# Patient Record
Sex: Male | Born: 1960 | Race: White | Hispanic: No | Marital: Married | Smoking: Never smoker
Health system: Southern US, Community
[De-identification: ages and names within clinical notes are randomized; demographics above are authoritative.]

## PROBLEM LIST (undated history)

## (undated) DIAGNOSIS — G4730 Sleep apnea, unspecified: Secondary | ICD-10-CM

## (undated) DIAGNOSIS — G4700 Insomnia, unspecified: Secondary | ICD-10-CM

## (undated) HISTORY — PX: NO PAST SURGERIES: SHX2092

## (undated) HISTORY — DX: Sleep apnea, unspecified: G47.30

## (undated) HISTORY — DX: Insomnia, unspecified: G47.00

---

## 2009-08-10 ENCOUNTER — Observation Stay: Payer: Self-pay | Admitting: Internal Medicine

## 2011-02-09 ENCOUNTER — Ambulatory Visit (INDEPENDENT_AMBULATORY_CARE_PROVIDER_SITE_OTHER): Payer: BC Managed Care – PPO

## 2011-02-09 DIAGNOSIS — R142 Eructation: Secondary | ICD-10-CM

## 2011-02-09 DIAGNOSIS — R141 Gas pain: Secondary | ICD-10-CM

## 2011-02-09 DIAGNOSIS — R1032 Left lower quadrant pain: Secondary | ICD-10-CM

## 2011-02-09 DIAGNOSIS — R110 Nausea: Secondary | ICD-10-CM

## 2011-02-10 ENCOUNTER — Other Ambulatory Visit: Payer: Self-pay | Admitting: Emergency Medicine

## 2011-02-13 ENCOUNTER — Other Ambulatory Visit: Payer: Self-pay | Admitting: Gastroenterology

## 2011-02-13 DIAGNOSIS — R1011 Right upper quadrant pain: Secondary | ICD-10-CM

## 2011-02-13 DIAGNOSIS — R6881 Early satiety: Secondary | ICD-10-CM

## 2011-02-14 ENCOUNTER — Ambulatory Visit
Admission: RE | Admit: 2011-02-14 | Discharge: 2011-02-14 | Disposition: A | Discharge status: Home / Self Care | Payer: BC Managed Care – PPO | Source: Ambulatory Visit | Attending: Emergency Medicine | Admitting: Emergency Medicine

## 2011-02-20 ENCOUNTER — Encounter (HOSPITAL_COMMUNITY)
Admission: RE | Admit: 2011-02-20 | Discharge: 2011-02-20 | Disposition: A | Discharge status: Home / Self Care | Payer: BC Managed Care – PPO | Source: Ambulatory Visit | Attending: Gastroenterology | Admitting: Gastroenterology

## 2011-02-20 DIAGNOSIS — R6881 Early satiety: Secondary | ICD-10-CM | POA: Insufficient documentation

## 2011-02-20 DIAGNOSIS — R1011 Right upper quadrant pain: Secondary | ICD-10-CM | POA: Insufficient documentation

## 2011-02-20 MED ORDER — TECHNETIUM TC 99M SULFUR COLLOID
2.0000 | Freq: Once | INTRAVENOUS | Status: AC | PRN
  Administered 2011-02-20: 2 via INTRAVENOUS

## 2012-12-09 ENCOUNTER — Ambulatory Visit: Payer: Self-pay | Admitting: Gastroenterology

## 2012-12-11 ENCOUNTER — Ambulatory Visit: Payer: Self-pay | Admitting: Family Medicine

## 2013-01-09 ENCOUNTER — Ambulatory Visit: Payer: Self-pay | Admitting: Family Medicine

## 2013-02-26 ENCOUNTER — Ambulatory Visit: Payer: Self-pay | Admitting: Family

## 2013-03-09 ENCOUNTER — Ambulatory Visit: Payer: Self-pay | Admitting: Family

## 2015-05-31 ENCOUNTER — Ambulatory Visit (INDEPENDENT_AMBULATORY_CARE_PROVIDER_SITE_OTHER): Payer: Commercial Managed Care - HMO | Admitting: Family Medicine

## 2015-05-31 ENCOUNTER — Encounter: Payer: Self-pay | Admitting: Family Medicine

## 2015-05-31 VITALS — BP 106/62 | HR 68 | Temp 98.7°F | Resp 16 | Ht 69.0 in | Wt 228.6 lb

## 2015-05-31 DIAGNOSIS — K3184 Gastroparesis: Secondary | ICD-10-CM | POA: Insufficient documentation

## 2015-05-31 DIAGNOSIS — Z889 Allergy status to unspecified drugs, medicaments and biological substances status: Secondary | ICD-10-CM | POA: Insufficient documentation

## 2015-05-31 DIAGNOSIS — R739 Hyperglycemia, unspecified: Secondary | ICD-10-CM

## 2015-05-31 DIAGNOSIS — Z9989 Dependence on other enabling machines and devices: Secondary | ICD-10-CM

## 2015-05-31 DIAGNOSIS — G4733 Obstructive sleep apnea (adult) (pediatric): Secondary | ICD-10-CM | POA: Insufficient documentation

## 2015-05-31 DIAGNOSIS — G4700 Insomnia, unspecified: Secondary | ICD-10-CM | POA: Insufficient documentation

## 2015-05-31 LAB — POCT GLYCOSYLATED HEMOGLOBIN (HGB A1C): Hemoglobin A1C: 5.4

## 2015-05-31 NOTE — Patient Instructions (Signed)
Return as needed

## 2015-05-31 NOTE — Progress Notes (Signed)
Subjective:     Patient ID: Luke Stewart, male   DOB: July 19, 1960, 55 y.o.   MRN: [id number]  HPI  Chief Complaint  Patient presents with  . Abnormal Lab    Patient comes in office rquestin to have HgbA1C screened. Patient just had labs done through his employeer and Glucose level was 122,in 03/28/14  Glucose was at 132. Patient is unable to past certification screening through employeer till lab is drawn.   States his fasting sugars always tend to be high. Attributes it to his history of gastroparesis (prior G.I. Workup).Reports he eats several small meals daily to accomodate. 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 States he has had a normal colonoscopy and prior G.I.evaluaiton for mildly elevated liver tests c/w fatty liver.   Review of Systems     Objective:   Physical Exam  Constitutional: He appears well-developed and well-nourished. No distress.       Assessment:    1. Hyperglycemia - POCT glycosylated hemoglobin (Hb A1C)    Plan:    F/u prn.

## 2015-06-21 ENCOUNTER — Telehealth: Payer: Self-pay | Admitting: *Deleted

## 2015-06-21 DIAGNOSIS — R739 Hyperglycemia, unspecified: Secondary | ICD-10-CM

## 2015-06-21 NOTE — Telephone Encounter (Signed)
Labs ordered, printed and waiting up-front.

## 2015-07-14 ENCOUNTER — Encounter: Payer: Self-pay | Admitting: Family Medicine

## 2015-12-03 DIAGNOSIS — R739 Hyperglycemia, unspecified: Secondary | ICD-10-CM | POA: Insufficient documentation

## 2016-10-05 DIAGNOSIS — K3184 Gastroparesis: Secondary | ICD-10-CM | POA: Diagnosis not present

## 2016-10-05 DIAGNOSIS — R739 Hyperglycemia, unspecified: Secondary | ICD-10-CM | POA: Diagnosis not present

## 2016-11-07 DIAGNOSIS — F411 Generalized anxiety disorder: Secondary | ICD-10-CM | POA: Insufficient documentation

## 2016-11-07 DIAGNOSIS — Z0000 Encounter for general adult medical examination without abnormal findings: Secondary | ICD-10-CM | POA: Diagnosis not present

## 2016-11-07 DIAGNOSIS — G4733 Obstructive sleep apnea (adult) (pediatric): Secondary | ICD-10-CM | POA: Diagnosis not present

## 2016-11-07 DIAGNOSIS — R739 Hyperglycemia, unspecified: Secondary | ICD-10-CM | POA: Diagnosis not present

## 2016-11-22 DIAGNOSIS — R110 Nausea: Secondary | ICD-10-CM | POA: Diagnosis not present

## 2016-11-22 DIAGNOSIS — R300 Dysuria: Secondary | ICD-10-CM | POA: Diagnosis not present

## 2016-11-22 DIAGNOSIS — R509 Fever, unspecified: Secondary | ICD-10-CM | POA: Diagnosis not present

## 2016-11-22 DIAGNOSIS — N10 Acute tubulo-interstitial nephritis: Secondary | ICD-10-CM | POA: Diagnosis not present

## 2016-11-22 DIAGNOSIS — N410 Acute prostatitis: Secondary | ICD-10-CM | POA: Diagnosis not present

## 2016-12-04 ENCOUNTER — Ambulatory Visit: Payer: 59 | Admitting: Gastroenterology

## 2016-12-04 NOTE — Progress Notes (Deleted)
He has been referred for gastroparesis. He has  had a gastric emptying study back in 2013 and was diagnosed.         With history of non diabetic gastroparesis diagnosed by GES in 2013. Discussed long term plan for gastroparesis- usually affects solids > liquids. Suggest low fat, low residue diet, limit/avoid narcotics. Have multiple small meals more often rather than heavy meals. Will check TSH,Hba1c Perform EGD to r/o gastric outlet obstruction or a Bezoar. Options to treat an acute episode would be to back on on oral solids, promote liquid diet , Reglan to be used as a short term measure due to side effects with long term use. Provided patient information on gastroparesis.    I have discussed alternative options, risks & benefits,  which include, but are not limited to, bleeding, infection, perforation,respiratory complication & drug reaction.  The patient agrees with this plan & written consent will be obtained.

## 2016-12-06 ENCOUNTER — Ambulatory Visit (INDEPENDENT_AMBULATORY_CARE_PROVIDER_SITE_OTHER): Payer: 59 | Admitting: Gastroenterology

## 2016-12-06 ENCOUNTER — Encounter: Payer: Self-pay | Admitting: Gastroenterology

## 2016-12-06 ENCOUNTER — Other Ambulatory Visit
Admission: RE | Admit: 2016-12-06 | Discharge: 2016-12-06 | Disposition: A | Discharge status: Home / Self Care | Payer: 59 | Source: Ambulatory Visit | Attending: Gastroenterology | Admitting: Gastroenterology

## 2016-12-06 ENCOUNTER — Encounter (INDEPENDENT_AMBULATORY_CARE_PROVIDER_SITE_OTHER): Payer: Self-pay

## 2016-12-06 VITALS — BP 123/77 | HR 61 | Temp 98.1°F | Ht 69.0 in | Wt 224.4 lb

## 2016-12-06 DIAGNOSIS — K3184 Gastroparesis: Secondary | ICD-10-CM

## 2016-12-06 LAB — HEMOGLOBIN A1C
Hgb A1c MFr Bld: 5.9 % — ABNORMAL HIGH (ref 4.8–5.6)
Mean Plasma Glucose: 122.63 mg/dL

## 2016-12-06 LAB — TSH: TSH: 2.485 u[IU]/mL (ref 0.350–4.500)

## 2016-12-06 LAB — GLUCOSE, RANDOM: Glucose, Bld: 107 mg/dL — ABNORMAL HIGH (ref 65–99)

## 2016-12-06 NOTE — Progress Notes (Signed)
Jonathon Bellows MD, MRCP(U.K) [address]  [place]  [place], [place] [address]  Main: [phone]  Fax: [phone]   Gastroenterology Consultation  Referring Provider:     Kirk Ruths, MD Primary Care Physician:  Birdie Sons, MD   Primary Gastroenterologist:  Dr. Jonathon Bellows  Reason for Consultation:     Gastroparesis        HPI:   Luke Stewart is a 56 y.o. y/o male referred for consultation & management  by Dr. Caryn Section, Kirstie Peri, MD.   He has been referred for non diabetic gastroparesis. GES in 2013 confirmed gastroparesis. Hb 15.6 on 11/22/16 .    He says that back in 2013 was given a medication to squeeze his stomach, he was concerned about the side effects. Since then he has had symptoms episodic, felt full after meals, good times, of late has been "steady bad". No matter what he eats , feels bloated and "miserable".   Denies usage of pain medications. He has a Estate agent department physical. Gets an Hba1c is normal as his sugars are elevated .   Past Medical History:  Diagnosis Date  . Insomnia   . Sleep apnea     Past Surgical History:  Procedure Laterality Date  . NO PAST SURGERIES      Prior to Admission medications   Medication Sig Start Date End Date Taking? Authorizing Provider  clotrimazole-betamethasone (LOTRISONE) cream Apply topically. 10/05/16 10/05/17  [provider]  EPINEPHrine (EPIPEN 2-PAK) 0.3 mg/0.3 mL IJ SOAJ injection Reported on 05/31/2015 10/30/13   [provider]  escitalopram (LEXAPRO) 10 MG tablet Take by mouth. 10/05/16 01/03/17  [provider]    No family history on file.   Social History  Substance Use Topics  . Smoking status: Never Smoker  . Smokeless tobacco: Former Systems developer    Types: Chew  . Alcohol use Not on file    Allergies as of 12/06/2016 - Review Complete 05/31/2015  Allergen Reaction Noted  . Bee venom  05/31/2015  . Honey bee venom Swelling 05/31/2015    Review of  Systems:    All systems reviewed and negative except where noted in HPI.   Physical Exam:  There were no vitals taken for this visit. No LMP for male patient. Psych:  Alert and cooperative. Normal mood and affect. General:   Alert,  Well-developed, well-nourished, pleasant and cooperative in NAD Head:  Normocephalic and atraumatic. Eyes:  Sclera clear, no icterus.   Conjunctiva pink. Ears:  Normal auditory acuity. Nose:  No deformity, discharge, or lesions. Mouth:  No deformity or lesions,oropharynx pink & moist. Neck:  Supple; no masses or thyromegaly. Lungs:  Respirations even and unlabored.  Clear throughout to auscultation.   No wheezes, crackles, or rhonchi. No acute distress. Heart:  Regular rate and rhythm; no murmurs, clicks, rubs, or gallops. Abdomen:  Normal bowel sounds.  No bruits.  Soft, non-tender and non-distended without masses, hepatosplenomegaly or hernias noted.  No guarding or rebound tenderness.    Neurologic:  Alert and oriented x3;  grossly normal neurologically. Skin:  Intact without significant lesions or rashes. No jaundice. Lymph Nodes:  No significant cervical adenopathy. Psych:  Alert and cooperative. Normal mood and affect.  Imaging Studies: No results found.  Assessment and Plan:   Luke Stewart is a 56 y.o. y/o male has been referred for non diabetic gastroparesis   Plan  1. Counseled on life style changes, limit use of narcotics, lowe fiber diet ,  explained does not usually affect liquids. Consume small meals more often rather than large sized meals. Meals low in fat  2. EGD to r/o gastric outlet obstruction and a bezoar. 3. Reglan can be used for short term during acute episodes.  4. Patient information on gastroparesis given . 5. Check Hba1c,TSH,celiac serology . He may have some symptoms of bloating from SIBO- will consider Rx in the future if no better with above interventions.    I have discussed alternative options, risks & benefits,   which include, but are not limited to, bleeding, infection, perforation,respiratory complication & drug reaction.  The patient agrees with this plan & written consent will be obtained.   Follow up in 3 months  Dr Jonathon Bellows MD,MRCP(U.K)

## 2016-12-06 NOTE — Addendum Note (Signed)
Addended by: Peggye Ley on: 12/06/2016 11:23 AM   Modules accepted: Orders, SmartSet

## 2016-12-08 LAB — CELIAC DISEASE PANEL
Endomysial Ab, IgA: NEGATIVE
IGA: 331 mg/dL (ref 90–386)
Tissue Transglutaminase Ab, IgA: <2 U/mL (ref 0–3)

## 2016-12-11 ENCOUNTER — Telehealth: Payer: Self-pay | Admitting: Gastroenterology

## 2016-12-11 ENCOUNTER — Telehealth: Payer: Self-pay

## 2016-12-11 NOTE — Telephone Encounter (Signed)
Patient is returning your call.  

## 2016-12-11 NOTE — Telephone Encounter (Signed)
LVM for patient to callback for results per Dr. Vicente Males.   -     Inform tsh normal , minimally elevated glucose

## 2016-12-12 ENCOUNTER — Encounter: Payer: Self-pay | Admitting: Anesthesiology

## 2016-12-12 ENCOUNTER — Telehealth: Payer: Self-pay | Admitting: Gastroenterology

## 2016-12-12 NOTE — Telephone Encounter (Signed)
Patient wants to cancel his procedure for tomorrow due to expense.

## 2016-12-13 ENCOUNTER — Ambulatory Visit: Admission: RE | Admit: 2016-12-13 | Payer: 59 | Source: Ambulatory Visit | Admitting: Gastroenterology

## 2016-12-13 ENCOUNTER — Encounter: Admission: RE | Payer: Self-pay | Source: Ambulatory Visit

## 2016-12-13 SURGERY — ESOPHAGOGASTRODUODENOSCOPY (EGD) WITH PROPOFOL
Anesthesia: General

## 2016-12-23 DIAGNOSIS — R300 Dysuria: Secondary | ICD-10-CM | POA: Diagnosis not present

## 2016-12-23 DIAGNOSIS — N390 Urinary tract infection, site not specified: Secondary | ICD-10-CM | POA: Diagnosis not present

## 2016-12-23 DIAGNOSIS — N3001 Acute cystitis with hematuria: Secondary | ICD-10-CM | POA: Diagnosis not present

## 2017-01-15 NOTE — Progress Notes (Deleted)
01/16/2017 10:50 AM   Luke Stewart August 01, 1960 [id number]  Referring provider: Kirk Ruths, MD [facility] [place], [place] [address]  No chief complaint on file.   HPI: Patient is a 56 -year-old Caucasian male who presents today as a referral from Dr. Ronn Melena for gross hematuria and recurrent UTI's.    Patient has had three documented UTI's associated with hematuria.  He does/ does not have a prior history of nephrolithiasis, trauma to the genitourinary tract, BPH or malignancies of the genitourinary tract. ***  She/He does/does not have a family medical history of nephrolithiasis, malignancies of the genitourinary tract or hematuria. ***  Today, she/he are having/not having symptoms of frequent urination, urgency, dysuria, nocturia, incontinence, hesitancy, intermittency, straining to urinate or a weak urinary stream.  Her/His UA today demonstrates ***.  She/He is not experiencing any suprapubic pain, abdominal pain or flank pain. He/She denies any recent fevers, chills, nausea or vomiting. ***  She/He have/have not had any recent imaging studies. ***  He/She are/are not a smoker. She/He is a former smoker, with a*** ppd history.  Quit *** years ago.  They are/are not exposed to secondhand smoke.  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   ***  He/She has HTN. ***   He/She has a high BMI.     PMH: Past Medical History:  Diagnosis Date  . Insomnia   . Sleep apnea     Surgical History: Past Surgical History:  Procedure Laterality Date  . NO PAST SURGERIES      Home Medications:  Allergies as of 01/16/2017      Reactions   Bee Venom    bee stings   Honey Bee Venom Swelling   bee stings      Medication List        Accurate as of 01/15/17 10:50 AM. Always use your most recent med list.          clotrimazole-betamethasone cream Commonly known as:  LOTRISONE Apply  topically.   EPIPEN 2-PAK 0.3 mg/0.3 mL Soaj injection Generic drug:  EPINEPHrine Reported on 05/31/2015   escitalopram 10 MG tablet Commonly known as:  LEXAPRO Take by mouth.       Allergies:  Allergies  Allergen Reactions  . Bee Venom     bee stings  . Honey Bee Venom Swelling    bee stings    Family History: No family history on file.  Social History:  reports that  has never smoked. He has quit using smokeless tobacco. His smokeless tobacco use included chew. He reports that he drinks about 0.6 oz of alcohol per week. He reports that he does not use drugs.  ROS:                                        Physical Exam: There were no vitals taken for this visit.  Constitutional: Well nourished. Alert and oriented, No acute distress. HEENT: [place] AT, moist mucus membranes. Trachea midline, no masses. Cardiovascular: No clubbing, cyanosis, or edema. Respiratory: Normal respiratory effort, no increased work of breathing. GI: Abdomen is soft, non tender, non distended, no abdominal masses. Liver and spleen not palpable.  No hernias appreciated.  Stool sample for occult testing is not indicated.   GU: No CVA tenderness.  No bladder fullness or masses.  Patient with  circumcised/uncircumcised phallus. ***Foreskin easily retracted***  Urethral meatus is patent.  No penile discharge. No penile lesions or rashes. Scrotum without lesions, cysts, rashes and/or edema.  Testicles are located scrotally bilaterally. No masses are appreciated in the testicles. Left and right epididymis are normal. Rectal: Patient with  normal sphincter tone. Anus and perineum without scarring or rashes. No rectal masses are appreciated. Prostate is approximately *** grams, *** nodules are appreciated. Seminal vesicles are normal. Skin: No rashes, bruises or suspicious lesions. Lymph: No cervical or inguinal adenopathy. Neurologic: Grossly intact, no focal deficits, moving all 4  extremities. Psychiatric: Normal mood and affect.  Laboratory Data: Lab Results  Component Value Date   HGBA1C 5.9 (H) 12/06/2016    Lab Results  Component Value Date   TSH 2.485 12/06/2016    Urinalysis ***  Pertinent Imaging: ***  Assessment & Plan:  ***  1. *** hematuria  - I explained to the patient that there are a number of causes that can be associated with blood in the urine, such as stones, *** BPH, UTI's, damage to the urinary tract and/or cancer.  - At this time, I felt that the patient warranted further urologic evaluation.   The AUA guidelines state that a CT urogram is the preferred imaging study to evaluate hematuria.  - I explained to the patient that a contrast material will be injected into a vein and that in rare instances, an allergic reaction can result and may even life threatening   The patient denies any allergies to contrast***, iodine and/or seafood*** and is not taking metformin.***  - Her reproductive status is hysterectomy, postmenopausal, tubal ligation are unknown at this time.  We will obtain a serum pregnancy test today. ***  - On occasion, we may need to resort to a non-contrast study such as a renal ultrasound or a non-contrast CT if the patient has a contrast allergy or renal insufficiency.  In the latter case,  I told him/her *** that an upper tract study without contrast will lack the detail of excluding some urologic tumors.  Because of this, he/she would need to undergo cystoscopy with bilateral retrogrades in the OR to complete the hematuria workup in addition to the imaging studies. ***  - Following the imaging study,  I've recommended a cystoscopy. I described how this is performed, typically in an office setting with a flexible cystoscope. We described the risks, benefits, and possible side effects, the most common of which is a minor amount of blood in the urine and/or burning which usually resolves in 24 to 48 hours.  ***  - The patient had  the opportunity to ask questions which were answered. Based upon this discussion, the patient is willing to proceed. Therefore, I've ordered: a CT Urogram and cystoscopy.  - The patient will return following all of the above for discussion of the results.   - UA  - Urine culture  - BUN + creatinine    2. Recurrent UTI's  - criteria for recurrent UTI has been met with 2 or more infections in 6 months or 3 or greater infections in one year ***  - Patient is instructed to increase their water intake until the urine is pale yellow or clear (10 to 12 cups daily) ***  - probiotics (yogurt, oral pills or vaginal suppositories), take cranberry pills or drink the juice and Vitamin C 1,000 mg daily to acidify the urine should be added to their daily regimen ***  -. if using tampons, she  should remove them prior to urinating and change them often ***  -avoid soaking in tubs and wipe front to back after urinating ***  - benefit from core strengthening exercises has been seen.  We can refer her to PT if they desire ***  - advised them to have CATH UA's for urinalysis and culture to prevent skin contamination of the specimen  - reviewed symptoms of UTI and advised not to have urine checked or be treated for UTI if not experiencing symptoms  - discussed antibiotic stewardship with the patient    3. BPH with LUTS  - IPSS score is ***, it is stable/improving/worsening  - Continue conservative management, avoiding bladder irritants and timed voiding's  - most bothersome symptoms is/are ***  - Initiate alpha-blocker (***), discussed side effects ***  - Initiate 5 alpha reductase inhibitor (***), discussed side effects ***  - Continue tamsulosin 0.4 mg daily, alfuzosin 10 mg daily, Rapaflo 8 mg daily, terazosin, doxazosin, Cialis 5 mg daily and finasteride 5 mg daily, dutasteride 0.5 mg daily***:refills given  - Cannot tolerate medication or medication failure, schedule cystoscopy ***  - RTC in *** months for  IPSS, PSA, PVR and exam                                                     No Follow-up on file.  These notes generated with voice recognition software. I apologize for typographical errors.  Zara Council, [facility] [address] [place], [place] [address] [phone]

## 2017-01-16 ENCOUNTER — Ambulatory Visit: Payer: 59 | Admitting: Urology

## 2017-01-16 ENCOUNTER — Encounter: Payer: Self-pay | Admitting: Urology

## 2017-01-16 ENCOUNTER — Ambulatory Visit: Payer: Commercial Managed Care - HMO | Admitting: Urology

## 2017-01-16 VITALS — BP 153/78 | HR 66 | Ht 68.0 in | Wt 220.0 lb

## 2017-01-16 DIAGNOSIS — R350 Frequency of micturition: Secondary | ICD-10-CM | POA: Diagnosis not present

## 2017-01-16 DIAGNOSIS — N138 Other obstructive and reflux uropathy: Secondary | ICD-10-CM

## 2017-01-16 DIAGNOSIS — N401 Enlarged prostate with lower urinary tract symptoms: Secondary | ICD-10-CM | POA: Diagnosis not present

## 2017-01-16 DIAGNOSIS — N390 Urinary tract infection, site not specified: Secondary | ICD-10-CM

## 2017-01-16 DIAGNOSIS — R310 Gross hematuria: Secondary | ICD-10-CM | POA: Diagnosis not present

## 2017-01-16 LAB — MICROSCOPIC EXAMINATION
BACTERIA UA: NONE SEEN
Epithelial Cells (non renal): NONE SEEN /hpf (ref 0–10)

## 2017-01-16 LAB — URINALYSIS, COMPLETE
Bilirubin, UA: NEGATIVE
GLUCOSE, UA: NEGATIVE
KETONES UA: NEGATIVE
Leukocytes, UA: NEGATIVE
NITRITE UA: NEGATIVE
RBC, UA: NEGATIVE
UUROB: 0.2 mg/dL (ref 0.2–1.0)
pH, UA: 5.5 (ref 5.0–7.5)

## 2017-01-16 LAB — BLADDER SCAN AMB NON-IMAGING: Scan Result: 16

## 2017-01-16 NOTE — Progress Notes (Signed)
01/16/2017 12:09 PM   Luke Stewart Luke Stewart 10, 1962 [id number]  Referring provider: Kirk Ruths, MD [facility] [place], [place] [address]  Chief Complaint  Patient presents with  . New Patient (Initial Visit)    HPI: Patient is a 56 -year-old Caucasian male who presents today as a referral from Dr. Ronn Melena for gross hematuria and recurrent UTI's.    Patient has had three documented UTI's associated with hematuria.  He does not have a prior history of nephrolithiasis, trauma to the genitourinary tract, BPH or malignancies of the genitourinary tract.  He does not have a family medical history of nephrolithiasis, malignancies of the genitourinary tract or hematuria.   Today, he is having symptoms of frequent urination, dysuria, nocturia, hesitancy and a weak urinary stream.  His UA today demonstrates 3-10 RBC's.  He is not experiencing any suprapubic pain, abdominal pain or flank pain. He denies any recent fevers, chills, nausea or vomiting.  His PVR is 16 mL.    He has not had any recent imaging studies.   He is not a smoker.   He is not exposed to secondhand smoke.  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  He has HTN.   He has a high BMI.     He denies any constipation or diarrhea.  He drinks a lot of water in the warmer seasons.  He does not drink a lot of water in the cooler months.     PMH: Past Medical History:  Diagnosis Date  . Insomnia   . Sleep apnea     Surgical History: Past Surgical History:  Procedure Laterality Date  . NO PAST SURGERIES      Home Medications:  Allergies as of 01/16/2017      Reactions   Bee Venom    bee stings   Honey Bee Venom Swelling   bee stings      Medication List        Accurate as of 01/16/17 12:09 PM. Always use your most recent med list.          clotrimazole-betamethasone cream Commonly known as:  LOTRISONE Apply topically.   EPIPEN 2-PAK 0.3 mg/0.3  mL Soaj injection Generic drug:  EPINEPHrine Reported on 05/31/2015   escitalopram 10 MG tablet Commonly known as:  LEXAPRO Take 10 mg by mouth daily.   escitalopram 10 MG tablet Commonly known as:  LEXAPRO Take by mouth.       Allergies:  Allergies  Allergen Reactions  . Bee Venom     bee stings  . Honey Bee Venom Swelling    bee stings    Family History: Family History  Problem Relation Age of Onset  . Kidney cancer Neg Hx   . Kidney disease Neg Hx   . Prostate cancer Neg Hx     Social History:  reports that  has never smoked. He has quit using smokeless tobacco. His smokeless tobacco use included chew. He reports that he drinks about 0.6 oz of alcohol per week. He reports that he does not use drugs.  ROS: UROLOGY Frequent Urination?: Yes Hard to postpone urination?: No Burning/pain with urination?: Yes Get up at night to urinate?: No Leakage of urine?: No Urine stream starts and stops?: No Trouble starting stream?: Yes Do you have to strain to urinate?: No Blood in urine?: Yes Urinary tract infection?: Yes Sexually transmitted disease?: No Injury to kidneys or bladder?:  No Painful intercourse?: No Weak stream?: Yes Erection problems?: No Penile pain?: No  Gastrointestinal Nausea?: No Vomiting?: No Indigestion/heartburn?: No Diarrhea?: No Constipation?: No  Constitutional Fever: No Night sweats?: No Weight loss?: No Fatigue?: No  Skin Skin rash/lesions?: No Itching?: No  Eyes Blurred vision?: No Double vision?: No  Ears/Nose/Throat Sore throat?: No Sinus problems?: No  Hematologic/Lymphatic Swollen glands?: No Easy bruising?: No  Cardiovascular Leg swelling?: No Chest pain?: No  Respiratory Cough?: No Shortness of breath?: No  Endocrine Excessive thirst?: No  Musculoskeletal Back pain?: No Joint pain?: No  Neurological Headaches?: No Dizziness?: No  Psychologic Depression?: No Anxiety?: No  Physical Exam: BP  (!) 153/78   Pulse 66   Ht '5\' 8"'  (1.727 m)   Wt 220 lb (99.8 kg)   BMI 33.45 kg/m   Constitutional: Well nourished. Alert and oriented, No acute distress. HEENT: [place] AT, moist mucus membranes. Trachea midline, no masses. Cardiovascular: No clubbing, cyanosis, or edema. Respiratory: Normal respiratory effort, no increased work of breathing. GI: Abdomen is soft, non tender, non distended, no abdominal masses. Liver and spleen not palpable.  No hernias appreciated.  Stool sample for occult testing is not indicated.   GU: No CVA tenderness.  No bladder fullness or masses.  Patient with circumcised phallus. Urethral meatus is patent.  No penile discharge. No penile lesions or rashes. Scrotum without lesions, cysts, rashes and/or edema.  Testicles are located scrotally bilaterally. No masses are appreciated in the testicles. Left and right epididymis are normal. Rectal: Patient with  normal sphincter tone. Anus and perineum without scarring or rashes. No rectal masses are appreciated. Prostate is approximately 55 grams, no nodules are appreciated. Seminal vesicles are normal. Skin: No rashes, bruises or suspicious lesions. Lymph: No cervical or inguinal adenopathy. Neurologic: Grossly intact, no focal deficits, moving all 4 extremities. Psychiatric: Normal mood and affect.  Laboratory Data: Lab Results  Component Value Date   HGBA1C 5.9 (H) 12/06/2016    Lab Results  Component Value Date   TSH 2.485 12/06/2016    Urinalysis 3-10 RBC's.  See Epic.   Pertinent Imaging: Results for DARUS, HERSHMAN (MRN [id number]) as of 01/16/2017 11:52  Ref. Range 01/16/2017 11:44  Scan Result Unknown 16    Assessment & Plan:    1. Gross hematuria  - I explained to the patient that there are a number of causes that can be associated with blood in the urine, such as stones, BPH, UTI's, damage to the urinary tract and/or cancer - even though his hematuria seems to be associated with UTI's - cannot  find an etiology for the recurrent UTI's at today's visit - will pursue hematuria work up as he may have a stone acting as a nidus for infection  - At this time, I felt that the patient warranted further urologic evaluation.   The AUA guidelines state that a CT urogram is the preferred imaging study to evaluate hematuria.  - I explained to the patient that a contrast material will be injected into a vein and that in rare instances, an allergic reaction can result and may even life threatening   The patient denies any allergies to contrast, iodine and/or seafood and is not taking metformin  - Following the imaging study,  I've recommended a cystoscopy. I described how this is performed, typically in an office setting with a flexible cystoscope. We described the risks, benefits, and possible side effects, the most common of which is a minor amount of blood in  the urine and/or burning which usually resolves in 24 to 48 hours.    - The patient had the opportunity to ask questions which were answered. Based upon this discussion, the patient is willing to proceed. Therefore, I've ordered: a CT Urogram and cystoscopy.  - The patient will return following all of the above for discussion of the results.   - UA  - Urine culture  - BUN + creatinine    2. Recurrent UTI's  - criteria for recurrent UTI has been met with 2 or more infections in 6 months or 3 or greater infections in one year   - Patient is instructed to increase their water intake until the urine is pale yellow or clear (10 to 12 cups daily)  - probiotics (yogurt, oral pills or vaginal suppositories), take cranberry pills or drink the juice and Vitamin C 1,000 mg daily to acidify the urine should be added to their daily regimen   - avoid soaking in tubs    3. BPH with LUTS  - Continue conservative management, avoiding bladder irritants and timed voiding's  - most bothersome symptoms is/are frequency and hesitancy  - RTC when hematuria work up is  complete for reassessment if no worrisome findings are discovered                                            Return for CT Urogram report and cystoscopy.  These notes generated with voice recognition software. I apologize for typographical errors.  Zara Council, [facility] [address], [place], [place] [address] [phone]

## 2017-01-18 LAB — CULTURE, URINE COMPREHENSIVE

## 2017-02-01 ENCOUNTER — Ambulatory Visit
Admission: RE | Admit: 2017-02-01 | Discharge: 2017-02-01 | Disposition: A | Discharge status: Home / Self Care | Payer: Commercial Managed Care - HMO | Source: Ambulatory Visit | Attending: Urology | Admitting: Urology

## 2017-02-01 DIAGNOSIS — I700 Atherosclerosis of aorta: Secondary | ICD-10-CM | POA: Diagnosis not present

## 2017-02-01 DIAGNOSIS — K449 Diaphragmatic hernia without obstruction or gangrene: Secondary | ICD-10-CM | POA: Diagnosis not present

## 2017-02-01 DIAGNOSIS — K5730 Diverticulosis of large intestine without perforation or abscess without bleeding: Secondary | ICD-10-CM | POA: Insufficient documentation

## 2017-02-01 DIAGNOSIS — R310 Gross hematuria: Secondary | ICD-10-CM | POA: Diagnosis not present

## 2017-02-01 DIAGNOSIS — K760 Fatty (change of) liver, not elsewhere classified: Secondary | ICD-10-CM | POA: Diagnosis not present

## 2017-02-01 MED ORDER — IOPAMIDOL (ISOVUE-300) INJECTION 61%
125.0000 mL | Freq: Once | INTRAVENOUS | Status: AC | PRN
  Administered 2017-02-01: 150 mL via INTRAVENOUS

## 2017-02-08 ENCOUNTER — Encounter: Payer: Self-pay | Admitting: Urology

## 2017-02-08 ENCOUNTER — Ambulatory Visit: Payer: 59 | Admitting: Urology

## 2017-02-08 VITALS — BP 142/82 | HR 67 | Ht 68.0 in | Wt 235.0 lb

## 2017-02-08 DIAGNOSIS — R310 Gross hematuria: Secondary | ICD-10-CM | POA: Diagnosis not present

## 2017-02-08 LAB — URINALYSIS, COMPLETE
BILIRUBIN UA: NEGATIVE
GLUCOSE, UA: NEGATIVE
Ketones, UA: NEGATIVE
Leukocytes, UA: NEGATIVE
Nitrite, UA: NEGATIVE
PH UA: 5.5 (ref 5.0–7.5)
Protein, UA: NEGATIVE
Specific Gravity, UA: 1.025 (ref 1.005–1.030)
UUROB: 0.2 mg/dL (ref 0.2–1.0)

## 2017-02-08 LAB — MICROSCOPIC EXAMINATION
Bacteria, UA: NONE SEEN
Epithelial Cells (non renal): NONE SEEN /hpf (ref 0–10)
WBC, UA: NONE SEEN /hpf (ref 0–?)

## 2017-02-08 MED ORDER — LIDOCAINE HCL 2 % EX GEL
1.0000 "application " | Freq: Once | CUTANEOUS | Status: AC
  Administered 2017-02-08: 1 via URETHRAL

## 2017-02-08 MED ORDER — CIPROFLOXACIN HCL 500 MG PO TABS
500.0000 mg | ORAL_TABLET | Freq: Once | ORAL | Status: AC
  Administered 2017-02-08: 500 mg via ORAL

## 2017-02-08 NOTE — Progress Notes (Signed)
   02/08/17  CC:  Chief Complaint  Patient presents with  . Cysto    HPI: The patient is a 57 -year-old Caucasian male who presents today follow up of gross hematuria and LUTS.    Patient has had three documented UTI's associated with hematuria.  He does not have a prior history of nephrolithiasis, trauma to the genitourinary tract, BPH or malignancies of the genitourinary tract.  He does not have a family medical history of nephrolithiasis, malignancies of the genitourinary tract or hematuria.   Today, he is having symptoms of frequent urination, dysuria, nocturia, hesitancy and a weak urinary stream.  His UA today demonstrates 3-10 RBC's.  He is not experiencing any suprapubic pain, abdominal pain or flank pain. He denies any recent fevers, chills, nausea or vomiting.  His PVR is 16 mL.    Patient underwent CT hematuria protocol which was unremarkable for significant urological findings.  Blood pressure (!) 142/82, pulse 67, height 5\' 8"  (1.727 m), weight 235 lb (106.6 kg). NED. A&Ox3.   No respiratory distress   Abd soft, NT, ND Normal phallus with bilateral descended testicles  Cystoscopy Procedure Note  Patient identification was confirmed, informed consent was obtained, and patient was prepped using Betadine solution.  Lidocaine jelly was administered per urethral meatus.    Preoperative abx where received prior to procedure.     Pre-Procedure: - Inspection reveals a normal caliber ureteral meatus.  Procedure: The flexible cystoscope was introduced without difficulty - No urethral strictures/lesions are present. - Enlarged prostate Visually obstructive - Normal bladder neck - Bilateral ureteral orifices identified - Bladder mucosa  reveals no ulcers, tumors, or lesions - No bladder stones - No trabeculation  Retroflexion shows no intravesical lobe   Post-Procedure: - Patient tolerated the procedure well  Assessment/ Plan:  1. Gross hematuria -Negative  work up. Repeat urinalysis in one year.   2.  UTI The patient did have 1-2 UTIs in the last year which were symptomatic.  We discussed that this is not frequent enough to want to start antibiotics for suppressive therapy.  He is agreeable to this.  He will contact the office if he feels he is developing another UTI.  3.  Lower urinary tract symptoms Not bothered by these

## 2017-03-29 ENCOUNTER — Ambulatory Visit: Payer: 59 | Admitting: Urology

## 2017-03-29 ENCOUNTER — Encounter: Payer: Self-pay | Admitting: Urology

## 2017-03-29 VITALS — BP 146/78 | HR 64 | Ht 68.0 in | Wt 228.0 lb

## 2017-03-29 DIAGNOSIS — N401 Enlarged prostate with lower urinary tract symptoms: Secondary | ICD-10-CM

## 2017-03-29 DIAGNOSIS — Z87440 Personal history of urinary (tract) infections: Secondary | ICD-10-CM | POA: Diagnosis not present

## 2017-03-29 DIAGNOSIS — N138 Other obstructive and reflux uropathy: Secondary | ICD-10-CM | POA: Diagnosis not present

## 2017-03-29 DIAGNOSIS — N529 Male erectile dysfunction, unspecified: Secondary | ICD-10-CM | POA: Diagnosis not present

## 2017-03-29 DIAGNOSIS — Z87448 Personal history of other diseases of urinary system: Secondary | ICD-10-CM | POA: Diagnosis not present

## 2017-03-29 MED ORDER — SILDENAFIL CITRATE 20 MG PO TABS: ORAL_TABLET | ORAL | 3 refills | Status: AC

## 2017-03-29 MED ORDER — TADALAFIL 5 MG PO TABS: 5.0000 mg | ORAL_TABLET | Freq: Every day | ORAL | 3 refills | Status: DC | PRN

## 2017-03-29 NOTE — Progress Notes (Signed)
03/29/2017 9:31 AM   Luke Stewart [birth year] [id number]  Referring provider: Kirk Ruths, MD [place] [facility] [place], [place] [address]  No chief complaint on file.   HPI: 57 yo WM with a history of hematuria, BPH with LU TS and history of recurrent UTI's who presents today for ED.  History of hematuria Completed hematuria workup in January 2019 with CT urogram and cystoscopy.   No worrisome findings.  No further reports of gross hematuria.  Non smoker.  Event organiser.   BPH with LU TS Enlarged prostate Visually obstructive seen on cystoscopy in 02/2017.  Not bothered by urinary symptoms.  History of recurrent UTI's No UTI since his last visit with [place].  Patient not having symptoms of UTI at this visit.    Erectile dysfunction His SHIM score is 12, which is mild to moderate.  He has been having difficulty with erections since his hematuria workup.   His major complaint is maintaining an erection.  His libido is preserved.   His risk factors for ED are age, BPH and sleep apnea.  He denies any painful erections or curvatures with his erections.   He is no longer having spontaneous erections.   SHIM    Row Name 03/29/17 [phone]         SHIM: Over the last 6 months:   How do you rate your confidence that you could get and keep an erection?  Low     When you had erections with sexual stimulation, how often were your erections hard enough for penetration (entering your partner)?  Sometimes (about half the time)     During sexual intercourse, how often were you able to maintain your erection after you had penetrated (entered) your partner?  Most Times (much more than half the time)     During sexual intercourse, how difficult was it to maintain your erection to completion of intercourse?  Very Difficult     When you attempted sexual intercourse, how often was it satisfactory for you?  Almost Never or Never       SHIM Total Score   SHIM  12        Score: 1-7 Severe ED 8-11 Moderate ED 12-16 Mild-Moderate ED 17-21 Mild ED 22-25 No ED    PMH: Past Medical History:  Diagnosis Date  . Insomnia   . Sleep apnea     Surgical History: Past Surgical History:  Procedure Laterality Date  . NO PAST SURGERIES      Home Medications:  Allergies as of 03/29/2017      Reactions   Bee Venom    bee stings   Honey Bee Venom Swelling   bee stings      Medication List        Accurate as of 03/29/17  9:31 AM. Always use your most recent med list.          clotrimazole-betamethasone cream Commonly known as:  LOTRISONE Apply topically.   EPIPEN 2-PAK 0.3 mg/0.3 mL Soaj injection Generic drug:  EPINEPHrine Reported on 05/31/2015   escitalopram 10 MG tablet Commonly known as:  LEXAPRO Take 10 mg by mouth daily.   sildenafil 20 MG tablet Commonly known as:  REVATIO Take 3 to 5 tablets two hours before intercouse on an empty stomach.  Do not take with nitrates.   tadalafil 5 MG tablet Commonly known as:  CIALIS Take 1 tablet (5 mg total) by mouth daily as  needed for erectile dysfunction.       Allergies:  Allergies  Allergen Reactions  . Bee Venom     bee stings  . Honey Bee Venom Swelling    bee stings    Family History: Family History  Problem Relation Age of Onset  . Kidney cancer Neg Hx   . Kidney disease Neg Hx   . Prostate cancer Neg Hx     Social History:  reports that  has never smoked. He has quit using smokeless tobacco. His smokeless tobacco use included chew. He reports that he drinks about 0.6 oz of alcohol per week. He reports that he does not use drugs.  ROS: UROLOGY Frequent Urination?: No Hard to postpone urination?: No Burning/pain with urination?: No Get up at night to urinate?: No Leakage of urine?: No Urine stream starts and stops?: No Trouble starting stream?: No Do you have to strain to urinate?: No Blood in urine?: No Urinary tract infection?: No Sexually  transmitted disease?: No Injury to kidneys or bladder?: No Painful intercourse?: No Weak stream?: No Erection problems?: Yes Penile pain?: No  Gastrointestinal Nausea?: No Vomiting?: No Indigestion/heartburn?: No Diarrhea?: No Constipation?: No  Constitutional Fever: No Night sweats?: No Weight loss?: No Fatigue?: No  Skin Skin rash/lesions?: No Itching?: No  Eyes Blurred vision?: No Double vision?: No  Ears/Nose/Throat Sore throat?: No Sinus problems?: No  Hematologic/Lymphatic Swollen glands?: No Easy bruising?: No  Cardiovascular Leg swelling?: No Chest pain?: No  Respiratory Cough?: No Shortness of breath?: No  Endocrine Excessive thirst?: No  Musculoskeletal Back pain?: No Joint pain?: No  Neurological Headaches?: No Dizziness?: No  Psychologic Depression?: No Anxiety?: No  Physical Exam: BP (!) 146/78   Pulse 64   Ht 5\' 8"  (1.727 m)   Wt 228 lb (103.4 kg)   BMI 34.67 kg/m   Constitutional: Well nourished. Alert and oriented, No acute distress. HEENT: [place] AT, moist mucus membranes. Trachea midline, no masses. Cardiovascular: No clubbing, cyanosis, or edema. Respiratory: Normal respiratory effort, no increased work of breathing. GI: Abdomen is soft, non tender, non distended, no abdominal masses. Liver and spleen not palpable.  No hernias appreciated.  Stool sample for occult testing is not indicated.   GU: No CVA tenderness.  No bladder fullness or masses.  Patient with circumcised phallus.  Urethral meatus is patent.  No penile discharge. No penile lesions or rashes. Scrotum without lesions, cysts, rashes and/or edema.  Testicles are located scrotally bilaterally. No masses are appreciated in the testicles. Left and right epididymis are normal. Rectal: Deferred. Skin: No rashes, bruises or suspicious lesions. Lymph: No cervical or inguinal adenopathy. Neurologic: Grossly intact, no focal deficits, moving all 4  extremities. Psychiatric: Normal mood and affect.  Laboratory Data: Lab Results  Component Value Date   HGBA1C 5.9 (H) 12/06/2016    Lab Results  Component Value Date   TSH 2.485 12/06/2016    Urinalysis N/A  See Epic.   I have reviewed the labs.   Assessment & Plan:    Erectile dysfunction  - SHIM score is 12  - I explained to the patient that in order to achieve an erection it takes good functioning of the nervous system (parasympathetic and rs, sympathetic, sensory and motor), good blood flow into the erectile tissue of the penis and a desire to have sex - may be psychological as it started after his cystoscopy  - I explained that conditions like diabetes, hypertension, coronary artery disease, peripheral vascular disease, smoking, alcohol  consumption, age, sleep apnea and BPH can diminish the ability to have an erection - he is sleeping with his CPAP machine  - I explained the ED may be a risk marker for underlying CVD and he should follow up with PCP for further studies and possibly a recheck on his HbgA1c as he is a prediabetic   - we will obtain a serum testosterone level at this time; if it is abnormal we will need to repeat the study for confirmation  - A recent study published in Sex Med 2018 Apr 13 revealed moderate to vigorous aerobic exercise for 40 minutes 4 times per week can decrease erectile problems caused by physical inactivity, obesity, hypertension, metabolic syndrome and/or cardiovascular diseases  -Pending his insurance he will either take a daily 5 mg tadalafil or three 20 mg tablets of sildenafil prior to intercourse  - RTC in one month for SHIM  2. History of hematuria  - CTU and cystoscopy completed in 02/2017 - no worrisome findings  - patient to report any gross hematuria  - RTC in one year for UA  3. Recurrent UTI's  -Reviewed UTI prevention strategies   -Asked patient to contact us with symptoms of UTI' s for management   4. BPH with LUTS  -  Continue conservative management, avoiding bladder irritants and timed voiding's  - most bothersome symptoms is/are frequency and hesitancy  - not interested in pursue treatment at this time  -RTC in one year for I PSS, PSA and exam                                            Return in about 1 month (around 04/26/2017) for SHIM .  These notes generated with voice recognition software. I apologize for typographical errors.  Zara Council, [facility] [address], [place], [place] [address] [phone]

## 2017-03-30 LAB — TESTOSTERONE: TESTOSTERONE: 386 ng/dL (ref 264–916)

## 2017-03-30 LAB — PSA: Prostate Specific Ag, Serum: 0.3 ng/mL (ref 0.0–4.0)

## 2017-04-04 ENCOUNTER — Telehealth: Payer: Self-pay

## 2017-04-04 NOTE — Telephone Encounter (Signed)
-----   Message from Nori Riis, PA-C sent at 03/30/2017  7:57 AM EST ----- Please let the patient know that his PSA and testosterone are normal.

## 2017-04-04 NOTE — Telephone Encounter (Signed)
Letter sent.

## 2017-04-25 ENCOUNTER — Ambulatory Visit: Payer: 59 | Admitting: Urology

## 2017-05-07 NOTE — Progress Notes (Signed)
05/08/2017 11:16 AM   Luke Stewart 1960/06/26 [id number]  Referring provider: Kirk Ruths, MD [facility] [place], [place] [address]  Chief Complaint  Patient presents with  . Benign Prostatic Hypertrophy    HPI: 57 yo WM with a history of hematuria, BPH with LU TS, history of recurrent UTI's and ED who presents today for pain in his bladder.    He states that three days ago he started to have an uncomfortable feeling with urination.  He has noted anything that makes the uncomfortableness worse or better.  He is having associated frequency, feelings of incomplete bladder emptying, urgency, weak stream and straining to urinate.  Patient denies any gross hematuria, dysuria or suprapubic/flank pain.  Patient denies any fevers, chills, nausea or vomiting.  His I PSS score is 7/5.   His UA today is negative.  His PVR is 108 mL.    IPSS    Row Name 05/08/17 1500         International Prostate Symptom Score   How often have you had the sensation of not emptying your bladder?  Less than 1 in 5     How often have you had to urinate less than every two hours?  About half the time     How often have you found you stopped and started again several times when you urinated?  Not at All     How often have you found it difficult to postpone urination?  Less than 1 in 5 times     How often have you had a weak urinary stream?  Less than 1 in 5 times     How often have you had to strain to start urination?  Less than 1 in 5 times     How many times did you typically get up at night to urinate?  None     Total IPSS Score  7       Quality of Life due to urinary symptoms   If you were to spend the rest of your life with your urinary condition just the way it is now how would you feel about that?  Unhappy        Score:  1-7 Mild 8-19 Moderate 20-35 Severe  History of hematuria Completed hematuria workup in January 2019 with CT urogram and  cystoscopy.   No worrisome findings.  No further reports of gross hematuria.  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    BPH with LU TS Enlarged prostate Visually obstructive seen on cystoscopy in 02/2017.   History of recurrent UTI's No UTI since his last visit with [place].   Erectile dysfunction He has not noted any changes in his ED since his symptoms began.  His SHIM score is 12, which is mild to moderate.  He has been having difficulty with erections since his hematuria workup.   His major complaint is maintaining an erection.  His libido is preserved.   His risk factors for ED are age, BPH and sleep apnea.  He denies any painful erections or curvatures with his erections.   He is no longer having spontaneous erections.  He was given scripts for daily Cialis and prn Cialis at his last visit.  He did not fill either prescription.  SHIM    Row Name 03/29/17 [phone]         SHIM: Over the last 6 months:  How do you rate your confidence that you could get and keep an erection?  Low     When you had erections with sexual stimulation, how often were your erections hard enough for penetration (entering your partner)?  Sometimes (about half the time)     During sexual intercourse, how often were you able to maintain your erection after you had penetrated (entered) your partner?  Most Times (much more than half the time)     During sexual intercourse, how difficult was it to maintain your erection to completion of intercourse?  Very Difficult     When you attempted sexual intercourse, how often was it satisfactory for you?  Almost Never or Never       SHIM Total Score   SHIM  12        Score: 1-7 Severe ED 8-11 Moderate ED 12-16 Mild-Moderate ED 17-21 Mild ED 22-25 No ED    PMH: Past Medical History:  Diagnosis Date  . Insomnia   . Sleep apnea     Surgical History: Past Surgical History:  Procedure Laterality Date  . NO PAST SURGERIES      Home  Medications:  Allergies as of 05/08/2017      Reactions   Bee Venom    bee stings   Honey Bee Venom Swelling   bee stings      Medication List        Accurate as of 05/08/17 11:59 PM. Always use your most recent med list.          clotrimazole-betamethasone cream Commonly known as:  LOTRISONE Apply topically.   EPIPEN 2-PAK 0.3 mg/0.3 mL Soaj injection Generic drug:  EPINEPHrine Reported on 05/31/2015   escitalopram 10 MG tablet Commonly known as:  LEXAPRO Take 10 mg by mouth daily.   sildenafil 20 MG tablet Commonly known as:  REVATIO Take 3 to 5 tablets two hours before intercouse on an empty stomach.  Do not take with nitrates.   tadalafil 5 MG tablet Commonly known as:  CIALIS Take 1 tablet (5 mg total) by mouth daily as needed for erectile dysfunction.   tamsulosin 0.4 MG Caps capsule Commonly known as:  FLOMAX Take 1 capsule (0.4 mg total) by mouth daily.       Allergies:  Allergies  Allergen Reactions  . Bee Venom     bee stings  . Honey Bee Venom Swelling    bee stings    Family History: Family History  Problem Relation Age of Onset  . Kidney cancer Neg Hx   . Kidney disease Neg Hx   . Prostate cancer Neg Hx     Social History:  reports that he has never smoked. He has quit using smokeless tobacco. His smokeless tobacco use included chew. He reports that he drinks about 0.6 oz of alcohol per week. He reports that he does not use drugs.  ROS: UROLOGY Frequent Urination?: No Hard to postpone urination?: No Burning/pain with urination?: Yes Get up at night to urinate?: No Leakage of urine?: No Urine stream starts and stops?: No Trouble starting stream?: No Do you have to strain to urinate?: No Blood in urine?: No Urinary tract infection?: No Sexually transmitted disease?: No Injury to kidneys or bladder?: No Painful intercourse?: No Weak stream?: No Erection problems?: No Penile pain?: No  Gastrointestinal Nausea?: No Vomiting?:  No Indigestion/heartburn?: No Diarrhea?: No Constipation?: No  Constitutional Fever: No Night sweats?: No Weight loss?: No Fatigue?: No  Skin Skin rash/lesions?: No  Itching?: No  Eyes Blurred vision?: No Double vision?: No  Ears/Nose/Throat Sore throat?: No Sinus problems?: No  Hematologic/Lymphatic Swollen glands?: No Easy bruising?: No  Cardiovascular Leg swelling?: No Chest pain?: No  Respiratory Cough?: No Shortness of breath?: No  Endocrine Excessive thirst?: No  Musculoskeletal Back pain?: No Joint pain?: No  Neurological Headaches?: No Dizziness?: No  Psychologic Depression?: No Anxiety?: No  Physical Exam: BP 137/88 (BP Location: Right Arm, Patient Position: Sitting, Cuff Size: Large)   Pulse 62   Ht 5\' 8"  (1.727 m)   Wt 239 lb 4.8 oz (108.5 kg)   BMI 36.39 kg/m   Constitutional: Well nourished. Alert and oriented, No acute distress. HEENT: [place] AT, moist mucus membranes. Trachea midline, no masses. Cardiovascular: No clubbing, cyanosis, or edema. Respiratory: Normal respiratory effort, no increased work of breathing. GI: Abdomen is soft, non tender, non distended, no abdominal masses. Liver and spleen not palpable.  No hernias appreciated.  Stool sample for occult testing is not indicated.   GU: No CVA tenderness.  No bladder fullness or masses.  Patient with circumcised phallus. Urethral meatus is patent.  No penile discharge. No penile lesions or rashes. Scrotum without lesions, cysts, rashes and/or edema.  Testicles are located scrotally bilaterally. No masses are appreciated in the testicles. Left and right epididymis are normal. Rectal: Patient with  normal sphincter tone. Anus and perineum without scarring or rashes. No rectal masses are appreciated. Prostate is approximately 55 grams, no nodules are appreciated. Seminal vesicles are normal. Skin: No rashes, bruises or suspicious lesions. Lymph: No cervical or inguinal  adenopathy. Neurologic: Grossly intact, no focal deficits, moving all 4 extremities. Psychiatric: Normal mood and affect.  Laboratory Data: Lab Results  Component Value Date   HGBA1C 5.9 (H) 12/06/2016    Lab Results  Component Value Date   TSH 2.485 12/06/2016    Urinalysis N/A  See Epic.   I have reviewed the labs.  Pertinent Imaging Results for Luke Stewart, Luke Stewart (MRN [id number]) as of 05/23/2017 11:23  Ref. Range 05/08/2017 15:39  Scan Result Unknown 108   Assessment & Plan:    1. BPH with LUTS  - IPSS score is 7/5  - Continue conservative management, avoiding bladder irritants and timed voiding's  - most bothersome symptoms is/are uncomfortable urination  - Initiate alpha-blocker (tamsulosin 0.4 mg daily), discussed side effects   - RTC in one month for I PSS and PVR  2. Erectile dysfunction  - SHIM score is 12  - patient would like a script for daily Cialis 5 mg daily; script sent to pharmacy  3. History of hematuria  - CTU and cystoscopy completed in 02/2017 - no worrisome findings  - patient to report any gross hematuria  - RTC in one year for UA  4. Recurrent UTI's  - UA is negative - will send for culture as he is having symptoms - no antibiotic prescribed - will wait on urine culture and sensitivity results  -Reviewed UTI prevention strategies   -Asked patient to contact us with symptoms of UTI' s for management                                            Return in about 1 month (around 06/07/2017) for IPSS and PVR.  These notes generated with voice recognition software. I apologize for typographical errors.  Mariem Skolnick, PA-C  [facility] [address] [place],  [address] [phone]

## 2017-05-08 ENCOUNTER — Encounter: Payer: Self-pay | Admitting: Urology

## 2017-05-08 ENCOUNTER — Ambulatory Visit: Payer: 59 | Admitting: Urology

## 2017-05-08 VITALS — BP 137/88 | HR 62 | Ht 68.0 in | Wt 239.3 lb

## 2017-05-08 DIAGNOSIS — Z87448 Personal history of other diseases of urinary system: Secondary | ICD-10-CM | POA: Diagnosis not present

## 2017-05-08 DIAGNOSIS — Z87440 Personal history of urinary (tract) infections: Secondary | ICD-10-CM

## 2017-05-08 DIAGNOSIS — N138 Other obstructive and reflux uropathy: Secondary | ICD-10-CM | POA: Diagnosis not present

## 2017-05-08 DIAGNOSIS — N401 Enlarged prostate with lower urinary tract symptoms: Secondary | ICD-10-CM

## 2017-05-08 DIAGNOSIS — N529 Male erectile dysfunction, unspecified: Secondary | ICD-10-CM

## 2017-05-08 LAB — BLADDER SCAN AMB NON-IMAGING: Scan Result: 108

## 2017-05-08 MED ORDER — TAMSULOSIN HCL 0.4 MG PO CAPS: 0.4000 mg | ORAL_CAPSULE | Freq: Every day | ORAL | 3 refills | Status: AC

## 2017-05-08 MED ORDER — TAMSULOSIN HCL 0.4 MG PO CAPS: 0.4000 mg | ORAL_CAPSULE | Freq: Every day | ORAL | 3 refills | Status: DC

## 2017-05-08 MED ORDER — TADALAFIL 5 MG PO TABS: 5.0000 mg | ORAL_TABLET | Freq: Every day | ORAL | 3 refills | Status: AC | PRN

## 2017-05-09 LAB — URINALYSIS, COMPLETE
Bilirubin, UA: NEGATIVE
GLUCOSE, UA: NEGATIVE
Ketones, UA: NEGATIVE
LEUKOCYTES UA: NEGATIVE
Nitrite, UA: NEGATIVE
PROTEIN UA: NEGATIVE
RBC, UA: NEGATIVE
Specific Gravity, UA: 1.02 (ref 1.005–1.030)
Urobilinogen, Ur: 0.2 mg/dL (ref 0.2–1.0)
pH, UA: 6 (ref 5.0–7.5)

## 2017-05-09 LAB — MICROSCOPIC EXAMINATION
BACTERIA UA: NONE SEEN
Epithelial Cells (non renal): NONE SEEN /hpf (ref 0–10)
WBC UA: NONE SEEN /HPF (ref 0–5)

## 2017-05-10 LAB — CULTURE, URINE COMPREHENSIVE

## 2017-05-11 ENCOUNTER — Telehealth: Payer: Self-pay | Admitting: Family Medicine

## 2017-05-11 NOTE — Telephone Encounter (Signed)
He can take them both, but he only needs one for his prostate.  If he wants to treat his ED, he can fill the daily Cialis.

## 2017-05-11 NOTE — Telephone Encounter (Signed)
-----   Message from Nori Riis, PA-C sent at 05/11/2017  8:00 AM EDT ----- Please let Mr. Derwin know that his urine culture was negative.

## 2017-05-11 NOTE — Telephone Encounter (Signed)
Notified patient of results. He states he wants you to call him because the Cialis and Flomax are cheaper if purchased together. He thought that he only needed to take 1 or the other so he is not sure what to do.

## 2017-05-15 NOTE — Telephone Encounter (Signed)
Unable to leave message. Patient's VM not set up

## 2017-05-17 NOTE — Telephone Encounter (Signed)
Patient notified and will call his insurance company to find out why Cialis is more expensive without purchasing Flomax.

## 2017-06-07 ENCOUNTER — Ambulatory Visit: Payer: 59 | Admitting: Urology

## 2017-06-11 DIAGNOSIS — L821 Other seborrheic keratosis: Secondary | ICD-10-CM | POA: Diagnosis not present

## 2017-06-11 DIAGNOSIS — L570 Actinic keratosis: Secondary | ICD-10-CM | POA: Diagnosis not present

## 2017-06-11 DIAGNOSIS — D229 Melanocytic nevi, unspecified: Secondary | ICD-10-CM | POA: Diagnosis not present

## 2017-07-13 DIAGNOSIS — L089 Local infection of the skin and subcutaneous tissue, unspecified: Secondary | ICD-10-CM | POA: Diagnosis not present

## 2018-02-11 ENCOUNTER — Ambulatory Visit: Payer: 59 | Admitting: Urology

## 2018-02-19 DIAGNOSIS — M25562 Pain in left knee: Secondary | ICD-10-CM | POA: Diagnosis not present

## 2018-04-09 DIAGNOSIS — G4733 Obstructive sleep apnea (adult) (pediatric): Secondary | ICD-10-CM | POA: Diagnosis not present

## 2018-04-09 DIAGNOSIS — R739 Hyperglycemia, unspecified: Secondary | ICD-10-CM | POA: Diagnosis not present

## 2018-04-09 DIAGNOSIS — Z0000 Encounter for general adult medical examination without abnormal findings: Secondary | ICD-10-CM | POA: Diagnosis not present

## 2018-04-25 DIAGNOSIS — R945 Abnormal results of liver function studies: Secondary | ICD-10-CM | POA: Diagnosis not present

## 2018-05-06 ENCOUNTER — Other Ambulatory Visit: Payer: Self-pay | Admitting: Internal Medicine

## 2018-05-06 DIAGNOSIS — R740 Nonspecific elevation of levels of transaminase and lactic acid dehydrogenase [LDH]: Secondary | ICD-10-CM | POA: Diagnosis not present

## 2018-05-06 DIAGNOSIS — R7401 Elevation of levels of liver transaminase levels: Secondary | ICD-10-CM

## 2018-05-06 DIAGNOSIS — R768 Other specified abnormal immunological findings in serum: Secondary | ICD-10-CM

## 2018-05-06 DIAGNOSIS — K760 Fatty (change of) liver, not elsewhere classified: Secondary | ICD-10-CM | POA: Diagnosis not present

## 2018-05-07 ENCOUNTER — Other Ambulatory Visit: Payer: Self-pay | Admitting: Internal Medicine

## 2018-05-07 DIAGNOSIS — R740 Nonspecific elevation of levels of transaminase and lactic acid dehydrogenase [LDH]: Principal | ICD-10-CM

## 2018-05-07 DIAGNOSIS — R768 Other specified abnormal immunological findings in serum: Secondary | ICD-10-CM

## 2018-05-07 DIAGNOSIS — R7401 Elevation of levels of liver transaminase levels: Secondary | ICD-10-CM

## 2018-06-04 ENCOUNTER — Ambulatory Visit: Payer: 59

## 2018-07-08 ENCOUNTER — Ambulatory Visit: Payer: 59

## 2018-11-18 IMAGING — CT CT ABD-PEL WO/W CM
3 of 12 series · 11 of 46 positions shown, 17 images · IV contrast (APPLIED)
Comparison: 02/14/2011 abdominal sonogram.

CLINICAL DATA: Gross hematuria. Recent urinary tract infection.
Remote appendectomy.

EXAM:
CT ABDOMEN AND PELVIS WITHOUT AND WITH CONTRAST
TECHNIQUE: Multidetector CT imaging of the abdomen and pelvis was performed
following the standard protocol before and following the bolus
administration of intravenous contrast.
CONTRAST:  150mL 1KTU6E-6SS IOPAMIDOL (1KTU6E-6SS) INJECTION 61%

[Series 2: axial pre · axial · non-contrast · 0.92mm/px · z∈[-736,-336]mm · 6 of 112 slices shown, 11 images]
[im 16/112  soft-tissue]
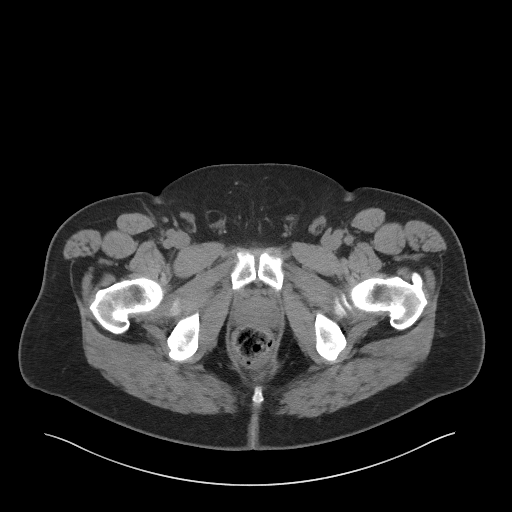
[im 16/112  bone]
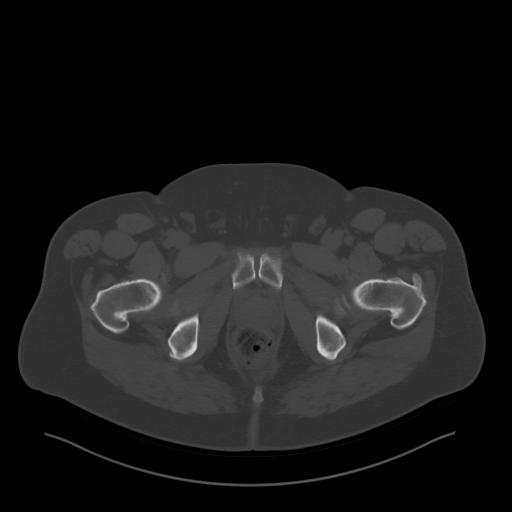
[im 32/112  soft-tissue]
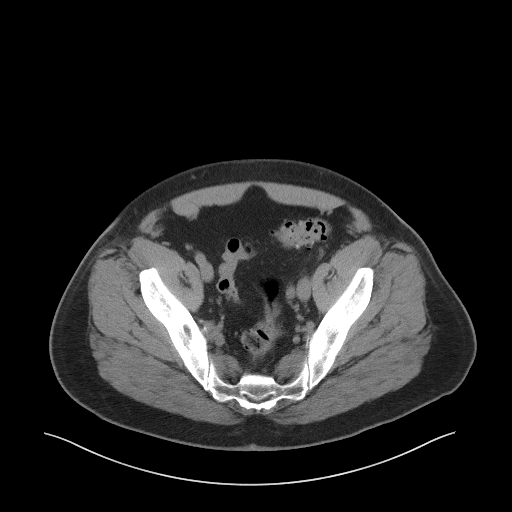
[im 48/112  soft-tissue]
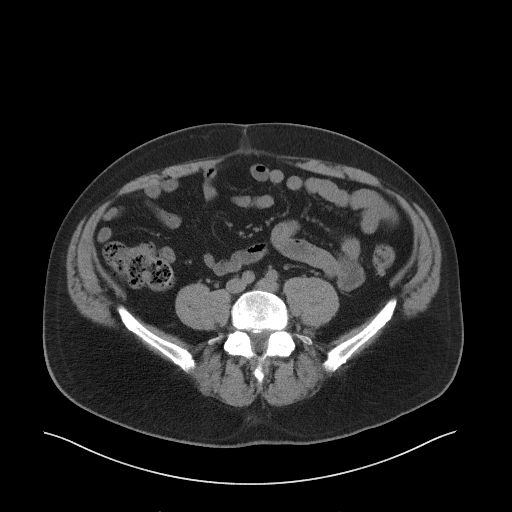
[im 48/112  lung]
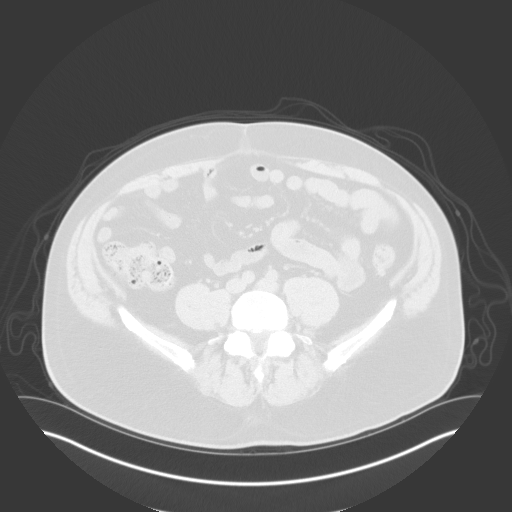
[im 64/112  soft-tissue]
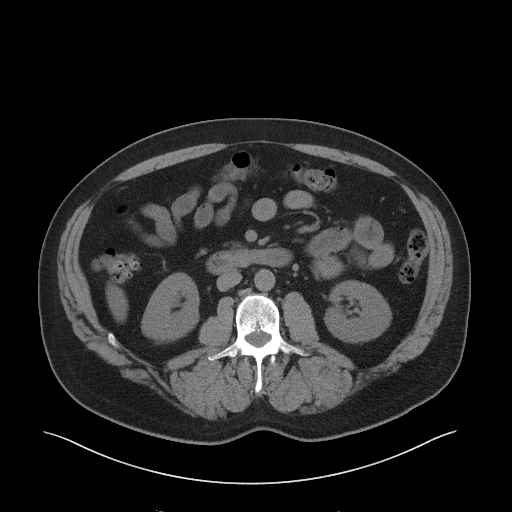
[im 64/112  lung]
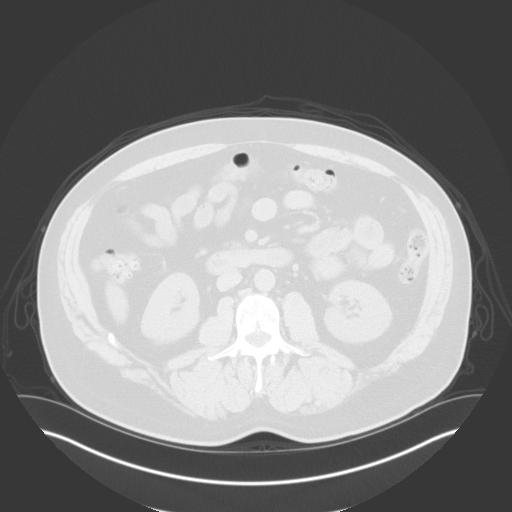
[im 80/112  soft-tissue]
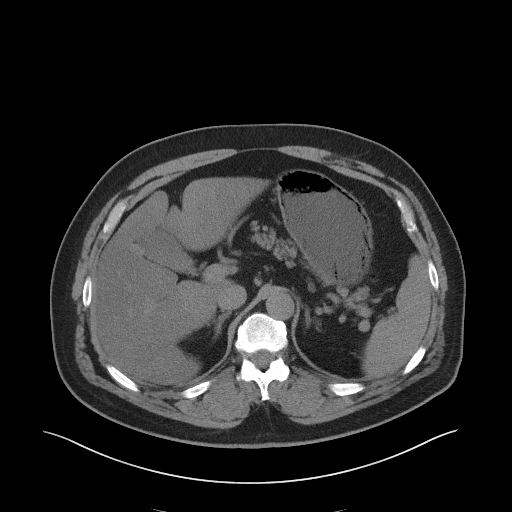
[im 80/112  lung]
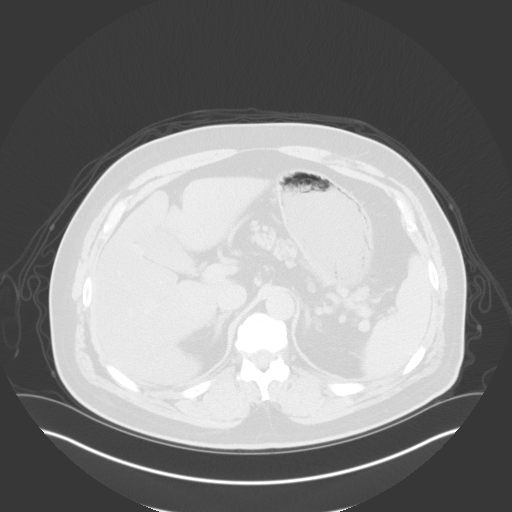
[im 96/112  soft-tissue]
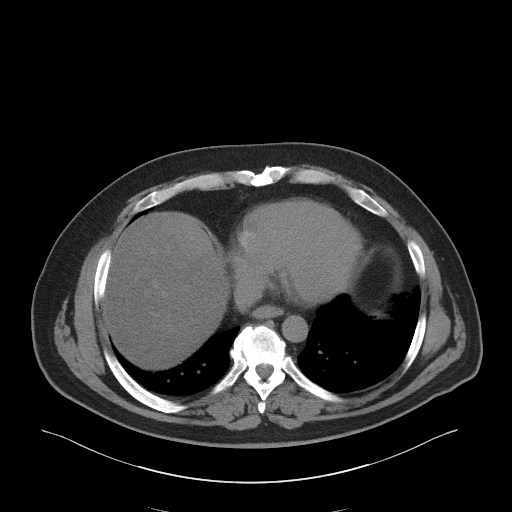
[im 96/112  lung]
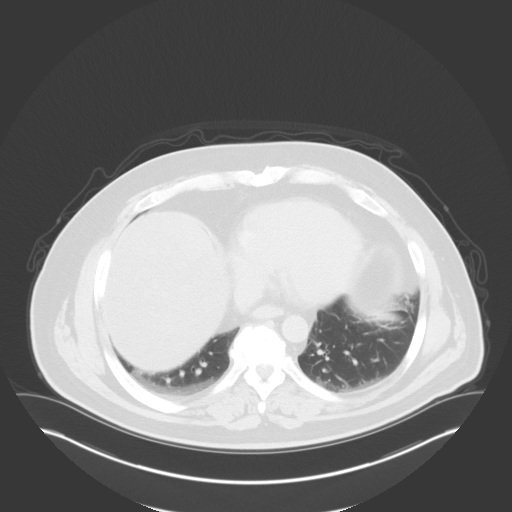

[Series 3: coronal pre · coronal · non-contrast · 0.87mm/px · 2 of 93 slices shown, 3 images]
[im 31/93  soft-tissue]
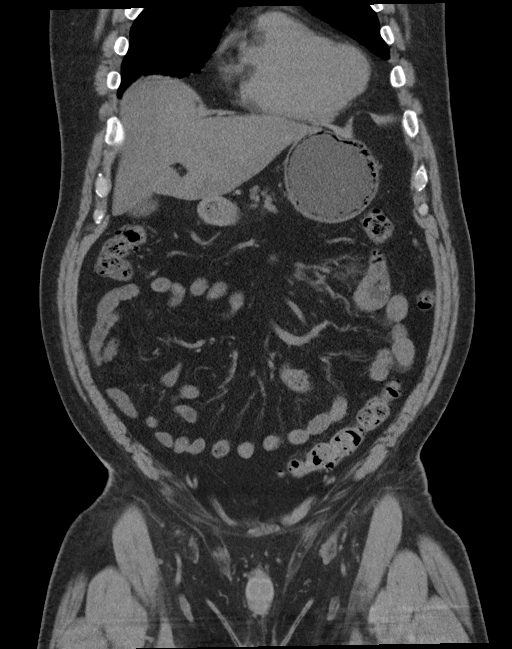
[im 31/93  bone]
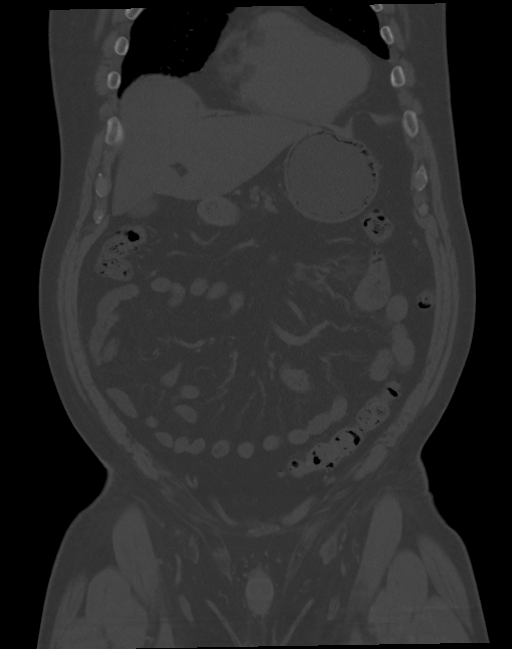
[im 62/93  soft-tissue]
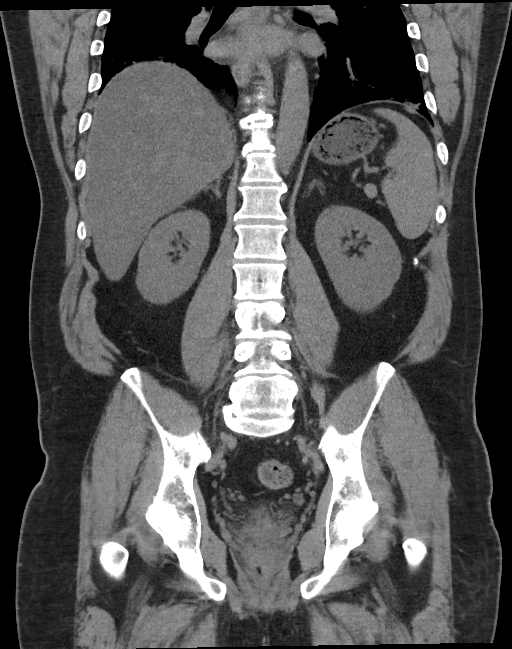

[Series 7: axial post · axial · 0.92mm/px · z∈[-741,-581]mm · 3 of 115 slices shown]
[im 17/115  soft-tissue]
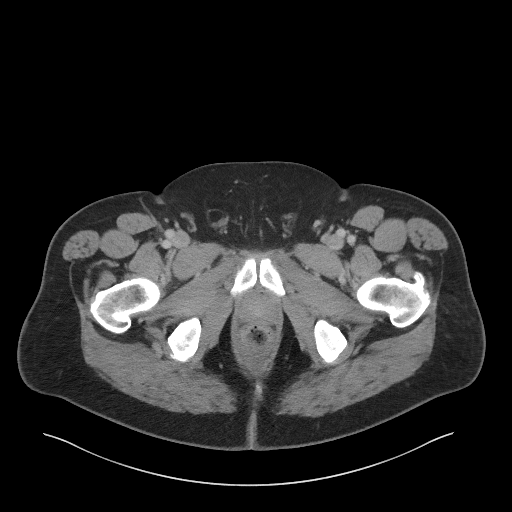
[im 33/115  soft-tissue]
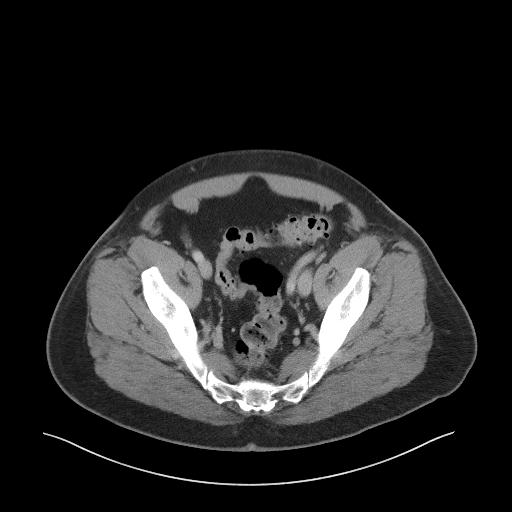
[im 49/115  soft-tissue]
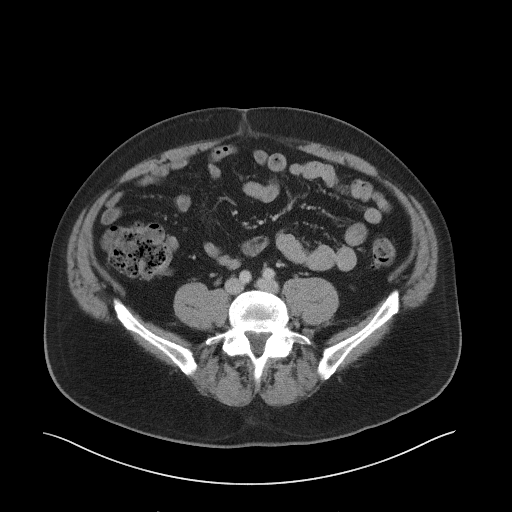

[11 of 46 positions shown; findings below may reference images not displayed]

FINDINGS: Lower chest: No significant pulmonary nodules or acute consolidative
airspace disease. Small fluid level in the lower thoracic esophagus
on the prone delayed scan.

Hepatobiliary: Diffuse hepatic steatosis. No definite liver surface
irregularity. No liver mass. Subcapsular fatty sparing in the liver
along the gallbladder fossa. Normal gallbladder with no radiopaque
cholelithiasis. No biliary ductal dilatation.

Pancreas: Heterogeneous fatty infiltration of the otherwise normal
pancreas, with no pancreatic mass or duct dilation.

Spleen: Normal size. No mass.

Adrenals/Urinary Tract: Normal adrenals. No hydronephrosis. No renal
stones. Normal caliber ureters, no ureteral stents. No renal
cortical masses. On delayed imaging, there is no urothelial wall
thickening and there are no filling defects in the opacified
portions of the bilateral collecting systems or ureters. No bladder
stones, definite wall thickening, masses or diverticula.

Stomach/Bowel: Small sliding hiatal hernia. Otherwise normal
nondistended stomach. Normal caliber small bowel with no small bowel
wall thickening. Appendectomy. Scattered mild colonic diverticulosis
throughout the large bowel, most prominent in the sigmoid colon,
with no large bowel wall thickening or pericolonic fat stranding.

Vascular/Lymphatic: Minimally atherosclerotic nonaneurysmal
abdominal aorta. Patent portal, splenic, hepatic and renal veins. No
pathologically enlarged lymph nodes in the abdomen or pelvis.

Reproductive: Normal size prostate. Prostate dimensions 3.5 x 3.1 x
4.1 cm (volume = 23 cm^3).

Other: No pneumoperitoneum, ascites or focal fluid collection.

Musculoskeletal: No aggressive appearing focal osseous lesions.
Moderate thoracolumbar spondylosis.
IMPRESSION: 1. No urolithiasis. No hydronephrosis. No renal cortical masses. No
evidence of urothelial lesions.
2. Chronic findings include: Small sliding hiatal hernia. Fluid in
the lower thoracic esophagus, suggesting esophageal dysmotility
and/or gastroesophageal reflux. Diffuse hepatic steatosis. Mild
diffuse colonic diverticulosis. Aortic Atherosclerosis
([id number]).

## 2023-06-27 ENCOUNTER — Encounter: Payer: Self-pay | Admitting: Physician Assistant

## 2023-06-27 ENCOUNTER — Ambulatory Visit (INDEPENDENT_AMBULATORY_CARE_PROVIDER_SITE_OTHER): Payer: Self-pay | Admitting: Physician Assistant

## 2023-06-27 VITALS — BP 123/81 | HR 64

## 2023-06-27 DIAGNOSIS — D492 Neoplasm of unspecified behavior of bone, soft tissue, and skin: Secondary | ICD-10-CM

## 2023-06-27 DIAGNOSIS — D0421 Carcinoma in situ of skin of right ear and external auricular canal: Secondary | ICD-10-CM

## 2023-06-27 DIAGNOSIS — W908XXA Exposure to other nonionizing radiation, initial encounter: Secondary | ICD-10-CM

## 2023-06-27 DIAGNOSIS — C4492 Squamous cell carcinoma of skin, unspecified: Secondary | ICD-10-CM

## 2023-06-27 DIAGNOSIS — D489 Neoplasm of uncertain behavior, unspecified: Secondary | ICD-10-CM

## 2023-06-27 DIAGNOSIS — L570 Actinic keratosis: Secondary | ICD-10-CM

## 2023-06-27 DIAGNOSIS — L814 Other melanin hyperpigmentation: Secondary | ICD-10-CM

## 2023-06-27 DIAGNOSIS — L578 Other skin changes due to chronic exposure to nonionizing radiation: Secondary | ICD-10-CM

## 2023-06-27 DIAGNOSIS — Z1283 Encounter for screening for malignant neoplasm of skin: Secondary | ICD-10-CM

## 2023-06-27 DIAGNOSIS — D1801 Hemangioma of skin and subcutaneous tissue: Secondary | ICD-10-CM

## 2023-06-27 DIAGNOSIS — L821 Other seborrheic keratosis: Secondary | ICD-10-CM

## 2023-06-27 HISTORY — DX: Squamous cell carcinoma of skin, unspecified: C44.92

## 2023-06-27 NOTE — Patient Instructions (Addendum)

## 2023-06-27 NOTE — Progress Notes (Signed)
 New Patient Visit   Subjective  Luke Stewart is a 63 y.o. male who presents for the following:  Total Body Skin Exam (TBSE)  Patient present today for new patient visit for TBSE.The patient denies he  has spots, moles and lesions to be evaluated, some may be new or changing and the patient may have concern these could be cancer. Patient has previously been treated by a dermatologist.Patient reports he  does not have hx of bx. Patient denies family history of skin cancers. Patient reports throughout his lifetime has had moderate sun exposure. Currently, patient reports if he  has excessive sun exposure, he  does apply sunscreen and/or wears protective coverings.  The following portions of the chart were reviewed this encounter and updated as appropriate: medications, allergies, medical history  Review of Systems:  No other skin or systemic complaints except as noted in HPI or Assessment and Plan.  Objective  Well appearing patient in no apparent distress; mood and affect are within normal limits.  A full examination was performed including scalp, head, eyes, ears, nose, lips, neck, chest, axillae, abdomen, back, buttocks, bilateral upper extremities, bilateral lower extremities, hands, feet, fingers, toes, fingernails, and toenails. All findings within normal limits unless otherwise noted below.   Relevant exam findings are noted in the Assessment and Plan. Right Helical Rim 1.0 cm pink scaly patch  Face, ears, arms (12) Scaly pink papules  Assessment & Plan   LENTIGINES, SEBORRHEIC KERATOSES, HEMANGIOMAS - Benign normal skin lesions - Benign-appearing - Call for any changes  MELANOCYTIC NEVI - Tan-brown and/or pink-flesh-colored symmetric macules and papules - Benign appearing on exam today - Observation - Call clinic for new or changing moles - Recommend daily use of broad spectrum spf 30+ sunscreen to sun-exposed areas.   ACTINIC DAMAGE - Chronic condition, secondary to  cumulative UV/sun exposure - diffuse scaly erythematous macules with underlying dyspigmentation - Recommend daily broad spectrum sunscreen SPF 30+ to sun-exposed areas, reapply every 2 hours as needed.  - Staying in the shade or wearing long sleeves, sun glasses (UVA+UVB protection) and wide brim hats (4-inch brim around the entire circumference of the hat) are also recommended for sun protection.  - Call for new or changing lesions.  SKIN CANCER SCREENING PERFORMED TODAY NEOPLASM OF UNCERTAIN BEHAVIOR Right Helical Rim Skin / nail biopsy Type of biopsy: tangential   Informed consent: discussed and consent obtained   Timeout: patient name, date of birth, surgical site, and procedure verified   Procedure prep:  Patient was prepped and draped in usual sterile fashion Prep type:  Isopropyl alcohol Anesthesia: the lesion was anesthetized in a standard fashion   Anesthetic:  1% lidocaine  w/ epinephrine 1-100,000 buffered w/ 8.4% NaHCO3 Instrument used: flexible razor blade   Hemostasis achieved with: pressure, aluminum chloride and electrodesiccation   Outcome: patient tolerated procedure well   Post-procedure details: sterile dressing applied and wound care instructions given   Dressing type: bandage and petrolatum   Specimen 1 - Surgical pathology Differential Diagnosis: R/O SCC   Check Margins: No ACTINIC KERATOSIS (12) Face, ears, arms (12) Destruction of lesion - Face, ears, arms (12) Complexity: simple   Destruction method: cryotherapy   Informed consent: discussed and consent obtained   Timeout:  patient name, date of birth, surgical site, and procedure verified Lesion destroyed using liquid nitrogen: Yes   Region frozen until ice ball extended beyond lesion: Yes   Outcome: patient tolerated procedure well with no complications   Post-procedure details: wound  care instructions given    Return in about 6 months (around 12/28/2023).  Exie Holler, CMA, am acting as  scribe for Leahann Lempke K, PA-C.    Documentation: I have reviewed the above documentation for accuracy and completeness, and I agree with the above.  Remi Lopata K, PA-C

## 2023-07-03 LAB — SURGICAL PATHOLOGY

## 2023-07-09 ENCOUNTER — Ambulatory Visit: Payer: Self-pay | Admitting: Physician Assistant

## 2023-07-12 ENCOUNTER — Encounter: Payer: Self-pay | Admitting: Physician Assistant

## 2023-07-12 NOTE — Telephone Encounter (Signed)
 Advised patient of results and we will have schedulers call to schedule appointment with Dr Paci/hd

## 2023-07-12 NOTE — Telephone Encounter (Signed)
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 Fain Home first - then discussion of Efudex after his Mohs surgery and follow up. Probably best to see patient every 6 months for the immediate future.

## 2023-08-01 ENCOUNTER — Encounter: Payer: Self-pay | Admitting: Dermatology

## 2023-08-07 ENCOUNTER — Ambulatory Visit (INDEPENDENT_AMBULATORY_CARE_PROVIDER_SITE_OTHER): Payer: Self-pay | Admitting: Dermatology

## 2023-08-07 ENCOUNTER — Encounter: Payer: Self-pay | Admitting: Dermatology

## 2023-08-07 VITALS — BP 130/83 | HR 60 | Temp 98.1°F

## 2023-08-07 DIAGNOSIS — D0421 Carcinoma in situ of skin of right ear and external auricular canal: Secondary | ICD-10-CM

## 2023-08-07 DIAGNOSIS — L821 Other seborrheic keratosis: Secondary | ICD-10-CM

## 2023-08-07 DIAGNOSIS — L814 Other melanin hyperpigmentation: Secondary | ICD-10-CM

## 2023-08-07 DIAGNOSIS — D485 Neoplasm of uncertain behavior of skin: Secondary | ICD-10-CM

## 2023-08-07 DIAGNOSIS — L570 Actinic keratosis: Secondary | ICD-10-CM

## 2023-08-07 DIAGNOSIS — C4492 Squamous cell carcinoma of skin, unspecified: Secondary | ICD-10-CM

## 2023-08-07 DIAGNOSIS — L579 Skin changes due to chronic exposure to nonionizing radiation, unspecified: Secondary | ICD-10-CM

## 2023-08-07 DIAGNOSIS — D492 Neoplasm of unspecified behavior of bone, soft tissue, and skin: Secondary | ICD-10-CM

## 2023-08-07 MED ORDER — MUPIROCIN 2 % EX OINT: 1.0000 | TOPICAL_OINTMENT | Freq: Two times a day (BID) | CUTANEOUS | 1 refills | Status: AC

## 2023-08-07 NOTE — Progress Notes (Signed)
 Follow-Up Visit   Subjective  Luke Stewart is a 63 y.o. 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 Patient is accompanied by his wife.   The following portions of the chart were reviewed this encounter and updated as appropriate: medications, allergies, medical history  Review of Systems:  No other skin or systemic complaints except as noted in HPI or Assessment and Plan.  Objective  Well appearing patient in no apparent distress; mood and affect are within normal limits.  A focused examination was performed of the following areas: Right helix rim Relevant physical exam findings are noted in the Assessment and Plan.   Right helical rim Healing biopsy site  Right Forearm 1.2 cm x 0.9 crusted pink plaque   Assessment & Plan   SQUAMOUS CELL CARCINOMA OF SKIN Right helical rim Mohs surgery  Consent obtained: written  Anticoagulation: Was the anticoagulation regimen changed prior to Mohs? No    Anesthesia: Anesthesia method: local infiltration Local anesthetic: lidocaine  1% WITH epi  Procedure Details: Timeout: pre-procedure verification complete Procedure Prep: patient was prepped and draped in usual sterile fashion Prep type: chlorhexidine Pre-Op diagnosis: squamous cell carcinoma SCC subtype: in situ MohsAIQ Surgical site (if tumor spans multiple areas, please select predominant area): ear Surgery side: right Surgical site (from skin exam): Right helical rim Pre-operative length (cm): 1.1 Pre-operative width (cm): 0.7 Indications for Mohs surgery: anatomic location where tissue conservation is critical and ill-defined borders  Micrographic Surgery Details: Post-operative length (cm): 1.7 Post-operative width (cm): 1.1 Number of Mohs stages: 2 Post surgery depth of defect: subcutaneous fat  Stage 1    Tumor features identified on Mohs section:  squamous cell carcinoma NEOPLASM OF UNCERTAIN BEHAVIOR OF SKIN Right Forearm Skin / nail biopsy Type of biopsy: tangential   Informed consent: discussed and consent obtained   Timeout: patient name, date of birth, surgical site, and procedure verified   Procedure prep:  Patient was prepped and draped in usual sterile fashion Prep type:  Isopropyl alcohol Anesthesia: the lesion was anesthetized in a standard fashion   Anesthetic:  1% lidocaine  w/ epinephrine 1-100,000 buffered w/ 8.4% NaHCO3 Instrument used: DermaBlade   Hemostasis achieved with: pressure and electrodesiccation   Outcome: patient tolerated procedure well   Post-procedure details: sterile dressing applied and wound care instructions given   Dressing type: petrolatum and pressure dressing   Specimen 1 - Surgical pathology Differential Diagnosis: R/O NMSC  Check Margins: No   Return in about 4 weeks (around 09/04/2023) for wound check.  LILLETTE Darice Smock, CMA, am acting as scribe for RUFUS CHRISTELLA HOLY, MD.    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 They note that the area has been present for about 6 months increasing in size with time.  There is no history of previous treatment.  Reports no other new or changing lesions and has no other complaints today.  Medications and allergies: see patient chart.  Review of systems: Reviewed 8 systems and notable for the above skin cancer.  All other systems reviewed are unremarkable/negative, unless noted in the HPI. Past medical history, surgical history, family history, social history were also reviewed and are noted in the chart/questionnaire.    PHYSICAL EXAMINATION  General: Well-appearing, in no  acute distress, alert and oriented x 4. Vitals reviewed in chart (if available).   Skin: Exam reveals a 1.1 x 0.7 cm  erythematous papule and biopsy scar on the right helical rim. There are rhytids, telangiectasias, and lentigines, consistent with photodamage.  Biopsy report(s) reviewed, confirming the diagnosis.   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 Due to location, size, histology, or recurrence and the likelihood of subclinical extension as well as the need to conserve normal surrounding tissue, the patient was deemed acceptable for Mohs micrographic surgery (MMS).  The nature and purpose of the procedure, associated benefits and risks including recurrence and scarring, possible complications such as pain, infection, and bleeding, and alternative methods of treatment if appropriate were discussed with the patient during consent. The lesion location was verified by the patient, by reviewing previous notes, pathology reports, and by photographs as well as angulation measurements if available.  Informed consent was reviewed and signed by the patient, and timeout was performed at 8:30 AM. See op note below.  2. For the photodamage and solar lentigines, sun protection discussed/information given on OTC sunscreens, and we recommend continued regular follow-up with primary dermatologist every 6 months or sooner for any growing, bleeding, or changing lesions. 3. Prognosis and future surveillance discussed. 4. Letter with treatment outcome sent to referring provider. 5. Pain acetaminophen/ibuprofen  MOHS MICROGRAPHIC SURGERY AND RECONSTRUCTION  Initial size:   1.1 x 0.7 cm Surgical defect/wound size: 1.7 x 1.1 cm Anesthesia:    0.33% lidocaine  with 1:200,000 epinephrine EBL:    <5 mL Complications:  None Repair type:   Secondary Intention   Stages: 2  STAGE I: Anesthesia achieved with 0.5% lidocaine  with 1:200,000 epinephrine. ChloraPrep applied. 1 section(s) excised using Mohs technique (this includes total  peripheral and deep tissue margin excision and evaluation with frozen sections, excised and interpreted by the same physician). The tumor was first debulked and then excised with an approx. 2 mm margin.  Hemostasis was achieved with electrocautery as needed.  The specimen was then oriented, subdivided/relaxed, inked, and processed using Mohs technique.    Frozen section analysis revealed a positive margin for full thickness epidermal architectural and cellular atypia, apoptotic cells, individual cell dyskeratosis with markedly altered maturation but usually still some surface keratinization and intercellular bridges present with marked nuclear atypia, including nuclear hyperchromasia and multinucleation in the peripheral margin.    STAGE II: An additional 2 mm margin was excised.  Hemostasis was achieved with electrocautery as needed.  The specimen was then oriented, subdivided/relaxed, inked, and processed using Mohs technique. Evaluation of slides by the Mohs surgeon revealed clear tumor margins.   Reconstruction  Patient was notified of results and repair options were discussed, including second intention healing. After reviewing the advantages and disadvantages of each, we agreed on second intention healing as appropriate.   The surgical site was then lightly scrubbed with sterile, saline-soaked gauze.  The area was bandaged using Vaseline ointment, non-adherent gauze, gauze pads, and tape to provide an adequate pressure dressing.   The patient tolerated the procedure well, was given detailed written and verbal wound care instructions, and was discharged in good condition.  The patient will follow-up in 4 weeks and as scheduled with primary dermatologist.   Documentation: I have reviewed the above documentation for accuracy and completeness, and I agree with the above.  RUFUS CHRISTELLA HOLY, MD

## 2023-08-07 NOTE — Patient Instructions (Signed)

## 2023-08-08 ENCOUNTER — Ambulatory Visit: Payer: Self-pay | Admitting: Dermatology

## 2023-08-08 ENCOUNTER — Encounter: Payer: Self-pay | Admitting: Dermatology

## 2023-08-08 LAB — SURGICAL PATHOLOGY

## 2023-09-04 ENCOUNTER — Encounter: Payer: Self-pay | Admitting: Dermatology

## 2023-09-04 ENCOUNTER — Ambulatory Visit: Payer: PRIVATE HEALTH INSURANCE | Admitting: Dermatology

## 2023-09-04 VITALS — BP 129/75 | HR 74

## 2023-09-04 DIAGNOSIS — C4492 Squamous cell carcinoma of skin, unspecified: Secondary | ICD-10-CM

## 2023-09-04 DIAGNOSIS — L570 Actinic keratosis: Secondary | ICD-10-CM

## 2023-09-04 DIAGNOSIS — S01301D Unspecified open wound of right ear, subsequent encounter: Secondary | ICD-10-CM

## 2023-09-04 DIAGNOSIS — T1490XD Injury, unspecified, subsequent encounter: Secondary | ICD-10-CM

## 2023-09-04 DIAGNOSIS — Z86007 Personal history of in-situ neoplasm of skin: Secondary | ICD-10-CM | POA: Diagnosis not present

## 2023-09-04 DIAGNOSIS — W908XXA Exposure to other nonionizing radiation, initial encounter: Secondary | ICD-10-CM | POA: Diagnosis not present

## 2023-09-04 NOTE — Patient Instructions (Signed)
 Post-Operative Scar Care: Education and Recommendations  Following your procedure, it's important to care for your scar to promote optimal healing and minimize its appearance. Proper post-operative care can help ensure that the scar heals well, and with time, it may become less noticeable. Below are key recommendations for scar care, including scar massage and the use of silicone scar gels or sheets.  1. General Scar Care Tips: -  Keep the wound clean and dry: Follow your healthcare provider's instructions for wound care, including cleaning the site and changing dressings as needed. -  Avoid sun exposure: Direct sunlight can darken scars and make them more noticeable. Once your wound has healed, apply sunscreen (SPF 30 or higher) to protect the scar from UV rays.  2. Scar Massage: - Start after healing: Wait until the scar has fully healed, with no scabs or open areas (usually 4-6 weeks after surgery). Your healthcare provider will give you specific guidance on when to begin. - Technique: Gently massage the scar in a circular motion for 5-10 minutes, 2-3 times per day. This helps to soften the tissue, reduce swelling, and improve the overall appearance of the scar. - Pressure: Apply gentle, firm pressure during the massage to break down the dense tissue that may form during healing. This helps to prevent the formation of keloids or hypertrophic scars. - Use lotion or ointment: Consider using a mild, fragrance-free lotion or vitamin E ointment to help lubricate the area during massage.  3. Silicone Scar Gels or Sheets: - When to start: Once your wound has healed completely, typically around 4-6 weeks, you can begin using silicone-based scar gels or sheets. These have been shown to improve scar appearance by hydrating the tissue and reducing inflammation. - How to use silicone gels: Apply a thin layer of the gel to the scar and allow it to dry before covering with clothing. You can use the  gel multiple times a day, depending on your provider's recommendation. - How to use silicone sheets: Cut the sheet to fit the size of your scar, and apply it directly to the healed scar. Wear it for 12-24 hours a day, and replace the sheet every few days as directed. - Benefits: Silicone helps reduce redness, flatten the scar, and improve its texture. Continued use over several months can lead to significant improvement in the appearance of the scar.  4. What to Expect: - Healing process: Scars generally take time to mature. The first few months may show redness or swelling, but this usually improves as healing progresses. - Long-term care: Scarring is a natural part of the healing process. While you cannot completely eliminate a scar, proper care can significantly improve its appearance over time. - Patience: It can take up to a year for a scar to fully mature, so it's important to be consistent with scar care and follow-up appointments with your provider.  5. When to Contact Your Healthcare Provider: - If you notice signs of infection (increased redness, warmth, drainage, or pain). - If your scar becomes unusually raised, itchy, or changes in color significantly. - If you have concerns about the appearance of your scar or experience unusual symptoms. - By following these guidelines, you can support your body's natural healing process and help ensure the best possible outcome for your scar. If you have any questions or concerns, please don't hesitate to contact our office.   Important Information  Due to recent changes in healthcare laws, you may see results  of your pathology and/or laboratory studies on MyChart before the doctors have had a chance to review them. We understand that in some cases there may be results that are confusing or concerning to you. Please understand that not all results are received at the same time and often the doctors may need to interpret multiple results in order to  provide you with the best plan of care or course of treatment. Therefore, we ask that you please give us  2 business days to thoroughly review all your results before contacting the office for clarification. Should we see a critical lab result, you will be contacted sooner.   If You Need Anything After Your Visit  If you have any questions or concerns for your doctor, please call our main line at [phone] If no one answers, please leave a voicemail as directed and we will return your call as soon as possible. Messages left after 4 pm will be answered the following business day.   You may also send us  a message via MyChart. We typically respond to MyChart messages within 1-2 business days.  For prescription refills, please ask your pharmacy to contact our office. Our fax number is [phone].  If you have an urgent issue when the clinic is closed that cannot wait until the next business day, you can page your doctor at the number below.    Please note that while we do our best to be available for urgent issues outside of office hours, we are not available 24/7.   If you have an urgent issue and are unable to reach us , you may choose to seek medical care at your doctor's office, retail clinic, urgent care center, or emergency room.  If you have a medical emergency, please immediately call 911 or go to the emergency department. In the event of inclement weather, please call our main line at [phone] for an update on the status of any delays or closures.  Dermatology Medication Tips: Please keep the boxes that topical medications come in in order to help keep track of the instructions about where and how to use these. Pharmacies typically print the medication instructions only on the boxes and not directly on the medication tubes.   If your medication is too expensive, please contact our office at [phone] or send us  a message through MyChart.   We are unable to tell what your  co-pay for medications will be in advance as this is different depending on your insurance coverage. However, we may be able to find a substitute medication at lower cost or fill out paperwork to get insurance to cover a needed medication.   If a prior authorization is required to get your medication covered by your insurance company, please allow us  1-2 business days to complete this process.  Drug prices often vary depending on where the prescription is filled and some pharmacies may offer cheaper prices.  The website www.goodrx.com contains coupons for medications through different pharmacies. The prices here do not account for what the cost may be with help from insurance (it may be cheaper with your insurance), but the website can give you the price if you did not use any insurance.  - You can print the associated coupon and take it with your prescription to the pharmacy.  - You may also stop by our office during regular business hours and pick up a GoodRx coupon card.  - If you need your prescription sent electronically to a different pharmacy, notify our office  through [facility] or by phone at [phone]    Skin Education :   I counseled the patient regarding the following: Sun screen (SPF 30 or greater) should be applied during peak UV exposure (between 10am and 2pm) and reapplied after exercise or swimming.  The ABCDEs of melanoma were reviewed with the patient, and the importance of monthly self-examination of moles was emphasized. Should any moles change in shape or color, or itch, bleed or burn, pt will contact our office for evaluation sooner then their interval appointment.  Plan: Sunscreen Recommendations I recommended a broad spectrum sunscreen with a SPF of 30 or higher. I explained that SPF 30 sunscreens block approximately 97 percent of the sun's harmful rays. Sunscreens should be applied at least 15 minutes prior to expected sun exposure and then every 2 hours  after that as long as sun exposure continues. If swimming or exercising sunscreen should be reapplied every 45 minutes to an hour after getting wet or sweating. One ounce, or the equivalent of a shot glass full of sunscreen, is adequate to protect the skin not covered by a bathing suit. I also recommended a lip balm with a sunscreen as well. Sun protective clothing can be used in lieu of sunscreen but must be worn the entire time you are exposed to the sun's rays. Liquid nitrogen was applied for 10-12 seconds to the skin lesion and the expected blistering or scabbing reaction explained. Do not pick at the area. Patient reminded to expect hypopigmented scars from the procedure. Return if lesion fails to fully resolve.

## 2023-09-04 NOTE — Progress Notes (Signed)
 Follow Up Visit   Subjective  Luke Stewart is a 63 y.o. 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 The patient has been bandaging the wound as directed. The endorse the following concerns: No questions or concerns at this time.  The following portions of the chart were reviewed this encounter and updated as appropriate: medications, allergies, medical history  Review of Systems:  No other skin or systemic complaints except as noted in HPI or Assessment and Plan.  Objective  Well appearing patient in no apparent distress; mood and affect are within normal limits.  A focal examination was performed including scalp, head, face and right ear. All findings within normal limits unless otherwise noted below.  Healing wound with mild erythema  Relevant physical exam findings are noted in the Assessment and Plan.    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, treated on 08/07/23, repaired with second intention - Reassured that wound is healing well - No evidence of infection - No swelling, induration, purulence, dehiscence, or tenderness out of proportion to the clinical exam, see photo above - Discussed that scars take up to 12 months to mature from the date of surgery - Recommend SPF 30+ to scar daily to prevent purple color from UV exposure during scar maturation process - Discussed that erythema and raised appearance of scar will fade over the next 4-6 months - OK to start scar massage at 4-6 weeks post-op - Can consider silicone based products for scar healing starting at 6 weeks post-op - Ok to continue ointment daily to wound under a bandage for another 1 week.  HISTORY OF SQUAMOUS CELL CARCINOMA IN SITU OF THE SKIN - No evidence of recurrence today - Recommend regular full body  skin exams - Recommend daily broad spectrum sunscreen SPF 30+ to sun-exposed areas, reapply every 2 hours as needed.  - Call if any new or changing lesions are noted between office visits   ACTINIC KERATOSIS Exam: Erythematous thin papules/macules with gritty scale  Actinic keratoses are precancerous spots that appear secondary to cumulative UV radiation exposure/sun exposure over time. They are chronic with expected duration over 1 year. A portion of actinic keratoses will progress to squamous cell carcinoma of the skin. It is not possible to reliably predict which spots will progress to skin cancer and so treatment is recommended to prevent development of skin cancer.  Recommend daily broad spectrum sunscreen SPF 30+ to sun-exposed areas, reapply every 2 hours as needed.  Recommend staying in the shade or wearing long sleeves, sun glasses (UVA+UVB protection) and wide brim hats (4-inch brim around the entire circumference of the hat). Call for new or changing lesions.  Treatment Plan:  Prior to procedure, discussed risks of blister formation, small wound, skin dyspigmentation, or rare scar following cryotherapy. Recommend Vaseline ointment to treated areas while healing.  Destruction Procedure Note Destruction method: cryotherapy   Informed consent: discussed and consent obtained   Lesion destroyed using liquid nitrogen: Yes   Outcome: patient tolerated procedure well with no complications   Post-procedure details: wound care instructions given   Locations: right forearm # of Lesions Treated: 1   Return if symptoms worsen or fail to improve.  I, Berwyn Lesches, Surg Tech III, am acting as Neurosurgeon for [employer]  Luke HOLY, MD.   Documentation: I have reviewed the above documentation for accuracy and completeness, and I agree with the above.  Luke Luke HOLY, MD

## 2023-12-24 ENCOUNTER — Encounter: Payer: Self-pay | Admitting: Physician Assistant

## 2023-12-24 ENCOUNTER — Ambulatory Visit (INDEPENDENT_AMBULATORY_CARE_PROVIDER_SITE_OTHER): Payer: PRIVATE HEALTH INSURANCE | Admitting: Physician Assistant

## 2023-12-24 VITALS — BP 121/81

## 2023-12-24 DIAGNOSIS — L570 Actinic keratosis: Secondary | ICD-10-CM | POA: Diagnosis not present

## 2023-12-24 DIAGNOSIS — L578 Other skin changes due to chronic exposure to nonionizing radiation: Secondary | ICD-10-CM | POA: Diagnosis not present

## 2023-12-24 DIAGNOSIS — L814 Other melanin hyperpigmentation: Secondary | ICD-10-CM

## 2023-12-24 DIAGNOSIS — L821 Other seborrheic keratosis: Secondary | ICD-10-CM | POA: Diagnosis not present

## 2023-12-24 DIAGNOSIS — D229 Melanocytic nevi, unspecified: Secondary | ICD-10-CM

## 2023-12-24 DIAGNOSIS — D225 Melanocytic nevi of trunk: Secondary | ICD-10-CM

## 2023-12-24 DIAGNOSIS — W908XXA Exposure to other nonionizing radiation, initial encounter: Secondary | ICD-10-CM | POA: Diagnosis not present

## 2023-12-24 DIAGNOSIS — Z1283 Encounter for screening for malignant neoplasm of skin: Secondary | ICD-10-CM

## 2023-12-24 DIAGNOSIS — Z8589 Personal history of malignant neoplasm of other organs and systems: Secondary | ICD-10-CM

## 2023-12-24 DIAGNOSIS — Z85828 Personal history of other malignant neoplasm of skin: Secondary | ICD-10-CM

## 2023-12-24 DIAGNOSIS — D485 Neoplasm of uncertain behavior of skin: Secondary | ICD-10-CM

## 2023-12-24 DIAGNOSIS — D1801 Hemangioma of skin and subcutaneous tissue: Secondary | ICD-10-CM

## 2023-12-24 NOTE — Patient Instructions (Signed)

## 2023-12-24 NOTE — Progress Notes (Signed)
 Follow-Up Visit   Subjective  Luke Stewart is a 63 y.o. male ESTABLISHED PATIENT who presents for the following: 6 month follow up  from 08/07/23 where he underwent Mohs surgery for a SCC on his right helical rim. Few areas of concern today.     The following portions of the chart were reviewed this encounter and updated as appropriate: medications, allergies, medical history  Review of Systems:  No other skin or systemic complaints except as noted in HPI or Assessment and Plan.  Objective  Well appearing patient in no apparent distress; mood and affect are within normal limits.  A focused examination was performed of the following areas: UPPER BODY SKIN EXAM   Relevant exam findings are noted in the Assessment and Plan.  Mid lower back Irregular brown macule 0.6 cm  Right Temple Irregular tan and brown patch 0.9 cm  Right Ear Erythematous thin papules/macules with gritty scale.   Assessment & Plan   LENTIGINES, SEBORRHEIC KERATOSES, HEMANGIOMAS - Benign normal skin lesions - Benign-appearing - Call for any changes  MELANOCYTIC NEVI - Tan-brown and/or pink-flesh-colored symmetric macules and papules - Benign appearing on exam today - Observation - Call clinic for new or changing moles - Recommend daily use of broad spectrum spf 30+ sunscreen to sun-exposed areas.   ACTINIC DAMAGE - Chronic condition, secondary to cumulative UV/sun exposure - diffuse scaly erythematous macules with underlying dyspigmentation - Recommend daily broad spectrum sunscreen SPF 30+ to sun-exposed areas, reapply every 2 hours as needed.  - Staying in the shade or wearing long sleeves, sun glasses (UVA+UVB protection) and wide brim hats (4-inch brim around the entire circumference of the hat) are also recommended for sun protection.  - Call for new or changing lesions.  SKIN CANCER SCREENING PERFORMED TODAY   HISTORY OF SQUAMOUS CELL CARCINOMA OF THE SKIN - No evidence of recurrence  today - No lymphadenopathy - Recommend regular full body skin exams - Recommend daily broad spectrum sunscreen SPF 30+ to sun-exposed areas, reapply every 2 hours as needed.  - Call if any new or changing lesions are noted between office visits   NEOPLASM OF UNCERTAIN BEHAVIOR OF SKIN (2) Mid lower back Epidermal / dermal shaving  Lesion diameter (cm):  0.6 Informed consent: discussed and consent obtained   Timeout: patient name, date of birth, surgical site, and procedure verified   Procedure prep:  Patient was prepped and draped in usual sterile fashion Prep type:  Isopropyl alcohol Anesthesia: the lesion was anesthetized in a standard fashion   Anesthetic:  1% lidocaine  w/ epinephrine 1-100,000 buffered w/ 8.4% NaHCO3 Instrument used: flexible razor blade   Hemostasis achieved with: pressure, aluminum chloride and electrodesiccation   Outcome: patient tolerated procedure well   Post-procedure details: sterile dressing applied and wound care instructions given   Dressing type: bandage and petrolatum    Specimen 1 - Surgical pathology Differential Diagnosis: Dysplastic nevus R/O MM  Check Margins: No Right Temple Skin / nail biopsy Type of biopsy: tangential   Informed consent: discussed and consent obtained   Timeout: patient name, date of birth, surgical site, and procedure verified   Procedure prep:  Patient was prepped and draped in usual sterile fashion Prep type:  Isopropyl alcohol Anesthesia: the lesion was anesthetized in a standard fashion   Anesthetic:  1% lidocaine  w/ epinephrine 1-100,000 buffered w/ 8.4% NaHCO3 Instrument used: flexible razor blade   Hemostasis achieved with: pressure, aluminum chloride and electrodesiccation   Outcome: patient tolerated procedure well  Post-procedure details: sterile dressing applied and wound care instructions given   Dressing type: bandage and petrolatum    Specimen 2 - Surgical pathology Differential Diagnosis: Lentigo  vs Lentigo Maligna  Check Margins: No AK (ACTINIC KERATOSIS) Right Ear Destruction of lesion - Right Ear Complexity: simple   Destruction method: cryotherapy   Informed consent: discussed and consent obtained   Timeout:  patient name, date of birth, surgical site, and procedure verified Lesion destroyed using liquid nitrogen: Yes   Region frozen until ice ball extended beyond lesion: Yes   Outcome: patient tolerated procedure well with no complications   Post-procedure details: wound care instructions given    SCREENING EXAM FOR SKIN CANCER   HISTORY OF SQUAMOUS CELL CARCINOMA   LENTIGINES   ACTINIC SKIN DAMAGE   SEBORRHEIC KERATOSIS   MULTIPLE BENIGN NEVI   CHERRY ANGIOMA    Return in about 6 months (around 06/22/2024) for UBSE.  I, Roseline Hutchinson, CMA, am acting as scribe for Fausto Sampedro K, PA-C .   Documentation: I have reviewed the above documentation for accuracy and completeness, and I agree with the above.  Shakeem Stern K, PA-C

## 2023-12-25 LAB — SURGICAL PATHOLOGY

## 2023-12-30 ENCOUNTER — Ambulatory Visit: Payer: Self-pay | Admitting: Physician Assistant

## 2024-01-13 ENCOUNTER — Other Ambulatory Visit: Payer: Self-pay | Admitting: Physician Assistant

## 2024-01-13 DIAGNOSIS — L578 Other skin changes due to chronic exposure to nonionizing radiation: Secondary | ICD-10-CM

## 2024-01-13 DIAGNOSIS — L570 Actinic keratosis: Secondary | ICD-10-CM

## 2024-01-13 MED ORDER — FLUOROURACIL 5 % EX CREA: TOPICAL_CREAM | CUTANEOUS | 0 refills | Status: AC

## 2024-01-13 NOTE — Progress Notes (Signed)
 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  Patient called and notified.  He will use on both temples and forehead.  Rx sent in.

## 2024-06-24 ENCOUNTER — Ambulatory Visit: Payer: PRIVATE HEALTH INSURANCE | Admitting: Physician Assistant
